# Patient Record
Sex: Female | Born: 1945 | Race: White | Hispanic: No | Marital: Married | Smoking: Former smoker
Health system: Southern US, Community
[De-identification: ages and names within clinical notes are randomized; demographics above are authoritative.]

## PROBLEM LIST (undated history)

## (undated) DIAGNOSIS — M51369 Other intervertebral disc degeneration, lumbar region without mention of lumbar back pain or lower extremity pain: Secondary | ICD-10-CM

## (undated) DIAGNOSIS — I10 Essential (primary) hypertension: Secondary | ICD-10-CM

## (undated) DIAGNOSIS — M5136 Other intervertebral disc degeneration, lumbar region: Secondary | ICD-10-CM

## (undated) DIAGNOSIS — C801 Malignant (primary) neoplasm, unspecified: Secondary | ICD-10-CM

## (undated) DIAGNOSIS — F419 Anxiety disorder, unspecified: Secondary | ICD-10-CM

## (undated) DIAGNOSIS — M1990 Unspecified osteoarthritis, unspecified site: Secondary | ICD-10-CM

## (undated) HISTORY — DX: Malignant (primary) neoplasm, unspecified: C80.1

## (undated) HISTORY — PX: OTHER SURGICAL HISTORY: SHX169

## (undated) HISTORY — PX: TONSILLECTOMY: SUR1361

---

## 2005-07-07 ENCOUNTER — Ambulatory Visit: Payer: Self-pay | Admitting: Internal Medicine

## 2005-07-30 ENCOUNTER — Ambulatory Visit: Payer: Self-pay | Admitting: Internal Medicine

## 2005-12-07 HISTORY — PX: BREAST BIOPSY: SHX20

## 2006-04-13 ENCOUNTER — Ambulatory Visit: Payer: Self-pay | Admitting: General Surgery

## 2006-05-24 ENCOUNTER — Ambulatory Visit: Payer: Self-pay | Admitting: General Surgery

## 2006-12-09 ENCOUNTER — Ambulatory Visit: Payer: Self-pay | Admitting: General Surgery

## 2007-09-15 ENCOUNTER — Ambulatory Visit: Payer: Self-pay | Admitting: Internal Medicine

## 2008-09-27 ENCOUNTER — Ambulatory Visit: Payer: Self-pay | Admitting: Internal Medicine

## 2009-10-02 ENCOUNTER — Ambulatory Visit: Payer: Self-pay | Admitting: Internal Medicine

## 2010-09-02 ENCOUNTER — Ambulatory Visit: Payer: Self-pay | Admitting: Internal Medicine

## 2010-10-07 ENCOUNTER — Ambulatory Visit: Payer: Self-pay | Admitting: General Surgery

## 2010-10-09 LAB — PATHOLOGY REPORT

## 2010-11-04 ENCOUNTER — Ambulatory Visit: Payer: Self-pay | Admitting: Internal Medicine

## 2011-02-04 ENCOUNTER — Other Ambulatory Visit: Payer: Self-pay | Admitting: Physician Assistant

## 2011-02-04 DIAGNOSIS — M79605 Pain in left leg: Secondary | ICD-10-CM

## 2011-02-08 ENCOUNTER — Other Ambulatory Visit: Payer: Self-pay

## 2011-03-16 ENCOUNTER — Ambulatory Visit: Payer: Self-pay

## 2011-11-12 ENCOUNTER — Ambulatory Visit: Payer: Self-pay | Admitting: Internal Medicine

## 2012-11-21 ENCOUNTER — Ambulatory Visit: Payer: Self-pay | Admitting: Internal Medicine

## 2013-05-05 DIAGNOSIS — M6740 Ganglion, unspecified site: Secondary | ICD-10-CM | POA: Insufficient documentation

## 2013-05-05 DIAGNOSIS — M19049 Primary osteoarthritis, unspecified hand: Secondary | ICD-10-CM | POA: Insufficient documentation

## 2013-06-22 ENCOUNTER — Encounter: Payer: Self-pay | Admitting: *Deleted

## 2013-12-05 ENCOUNTER — Ambulatory Visit: Payer: Self-pay | Admitting: Internal Medicine

## 2014-02-08 DIAGNOSIS — M6530 Trigger finger, unspecified finger: Secondary | ICD-10-CM | POA: Insufficient documentation

## 2014-03-21 ENCOUNTER — Other Ambulatory Visit: Payer: Self-pay | Admitting: Neurosurgery

## 2014-03-21 DIAGNOSIS — M549 Dorsalgia, unspecified: Secondary | ICD-10-CM

## 2014-03-29 ENCOUNTER — Ambulatory Visit
Admission: RE | Admit: 2014-03-29 | Discharge: 2014-03-29 | Disposition: A | Discharge status: Home / Self Care | Payer: 59 | Source: Ambulatory Visit | Attending: Neurosurgery | Admitting: Neurosurgery

## 2014-03-29 VITALS — BP 150/81 | HR 67

## 2014-03-29 DIAGNOSIS — M549 Dorsalgia, unspecified: Secondary | ICD-10-CM

## 2014-03-29 MED ORDER — DIAZEPAM 5 MG PO TABS
5.0000 mg | ORAL_TABLET | Freq: Once | ORAL | Status: AC
Start: 2014-03-29 — End: 2014-03-29
  Administered 2014-03-29: 5 mg via ORAL

## 2014-03-29 MED ORDER — IOHEXOL 180 MG/ML  SOLN
15.0000 mL | Freq: Once | INTRAMUSCULAR | Status: AC | PRN
  Administered 2014-03-29: 15 mL via INTRATHECAL

## 2014-03-29 NOTE — Progress Notes (Signed)
Patient states she has been off Buspar for at least the past two days.  jlk

## 2014-03-29 NOTE — Discharge Instructions (Signed)
Myelogram Discharge Instructions  1. Go home and rest quietly for the next 24 hours.  It is important to lie flat for the next 24 hours.  Get up only to go to the restroom.  You may lie in the bed or on a couch on your back, your stomach, your left side or your right side.  You may have one pillow under your head.  You may have pillows between your knees while you are on your side or under your knees while you are on your back.  2. DO NOT drive today.  Recline the seat as far back as it will go, while still wearing your seat belt, on the way home.  3. You may get up to go to the bathroom as needed.  You may sit up for 10 minutes to eat.  You may resume your normal diet and medications unless otherwise indicated.  Drink plenty of extra fluids today and tomorrow.  4. The incidence of a spinal headache with nausea and/or vomiting is about 5% (one in 20 patients).  If you develop a headache, lie flat and drink plenty of fluids until the headache goes away.  Caffeinated beverages may be helpful.  If you develop severe nausea and vomiting or a headache that does not go away with flat bed rest, call [phone].  5. You may resume normal activities after your 24 hours of bed rest is over; however, do not exert yourself strongly or do any heavy lifting tomorrow.  6. Call your physician for a follow-up appointment.   You may resume Buspar on Friday, March 30, 2014 after 11:00a.m.

## 2015-01-01 ENCOUNTER — Ambulatory Visit: Payer: Self-pay | Admitting: Internal Medicine

## 2016-03-14 ENCOUNTER — Inpatient Hospital Stay
Admission: EM | Admit: 2016-03-14 | Discharge: 2016-03-18 | DRG: 194 | Disposition: A | Discharge status: Home / Self Care | Payer: Medicare Other | Attending: Internal Medicine | Admitting: Internal Medicine

## 2016-03-14 ENCOUNTER — Encounter: Payer: Self-pay | Admitting: Emergency Medicine

## 2016-03-14 ENCOUNTER — Emergency Department: Payer: Medicare Other

## 2016-03-14 DIAGNOSIS — M1990 Unspecified osteoarthritis, unspecified site: Secondary | ICD-10-CM | POA: Diagnosis present

## 2016-03-14 DIAGNOSIS — J111 Influenza due to unidentified influenza virus with other respiratory manifestations: Secondary | ICD-10-CM

## 2016-03-14 DIAGNOSIS — J189 Pneumonia, unspecified organism: Secondary | ICD-10-CM | POA: Diagnosis not present

## 2016-03-14 DIAGNOSIS — F411 Generalized anxiety disorder: Secondary | ICD-10-CM | POA: Diagnosis present

## 2016-03-14 DIAGNOSIS — Z85828 Personal history of other malignant neoplasm of skin: Secondary | ICD-10-CM

## 2016-03-14 DIAGNOSIS — Z882 Allergy status to sulfonamides status: Secondary | ICD-10-CM

## 2016-03-14 DIAGNOSIS — Z87891 Personal history of nicotine dependence: Secondary | ICD-10-CM

## 2016-03-14 DIAGNOSIS — A419 Sepsis, unspecified organism: Secondary | ICD-10-CM

## 2016-03-14 DIAGNOSIS — T17890A Other foreign object in other parts of respiratory tract causing asphyxiation, initial encounter: Secondary | ICD-10-CM | POA: Diagnosis present

## 2016-03-14 DIAGNOSIS — I10 Essential (primary) hypertension: Secondary | ICD-10-CM | POA: Diagnosis present

## 2016-03-14 DIAGNOSIS — R6889 Other general symptoms and signs: Secondary | ICD-10-CM | POA: Diagnosis present

## 2016-03-14 DIAGNOSIS — R0602 Shortness of breath: Secondary | ICD-10-CM

## 2016-03-14 DIAGNOSIS — Z9889 Other specified postprocedural states: Secondary | ICD-10-CM

## 2016-03-14 DIAGNOSIS — Z825 Family history of asthma and other chronic lower respiratory diseases: Secondary | ICD-10-CM

## 2016-03-14 DIAGNOSIS — J9601 Acute respiratory failure with hypoxia: Secondary | ICD-10-CM

## 2016-03-14 DIAGNOSIS — Z8249 Family history of ischemic heart disease and other diseases of the circulatory system: Secondary | ICD-10-CM

## 2016-03-14 DIAGNOSIS — Z8261 Family history of arthritis: Secondary | ICD-10-CM

## 2016-03-14 DIAGNOSIS — R69 Illness, unspecified: Secondary | ICD-10-CM

## 2016-03-14 DIAGNOSIS — Z79899 Other long term (current) drug therapy: Secondary | ICD-10-CM

## 2016-03-14 DIAGNOSIS — F419 Anxiety disorder, unspecified: Secondary | ICD-10-CM | POA: Diagnosis present

## 2016-03-14 DIAGNOSIS — C4490 Unspecified malignant neoplasm of skin, unspecified: Secondary | ICD-10-CM | POA: Insufficient documentation

## 2016-03-14 DIAGNOSIS — Z823 Family history of stroke: Secondary | ICD-10-CM

## 2016-03-14 DIAGNOSIS — R0902 Hypoxemia: Secondary | ICD-10-CM | POA: Diagnosis present

## 2016-03-14 DIAGNOSIS — R6510 Systemic inflammatory response syndrome (SIRS) of non-infectious origin without acute organ dysfunction: Secondary | ICD-10-CM | POA: Diagnosis present

## 2016-03-14 HISTORY — DX: Other intervertebral disc degeneration, lumbar region: M51.36

## 2016-03-14 HISTORY — DX: Essential (primary) hypertension: I10

## 2016-03-14 HISTORY — DX: Anxiety disorder, unspecified: F41.9

## 2016-03-14 HISTORY — DX: Unspecified osteoarthritis, unspecified site: M19.90

## 2016-03-14 HISTORY — DX: Other intervertebral disc degeneration, lumbar region without mention of lumbar back pain or lower extremity pain: M51.369

## 2016-03-14 LAB — COMPREHENSIVE METABOLIC PANEL
ALT: 18 U/L (ref 14–54)
AST: 25 U/L (ref 15–41)
Albumin: 4.1 g/dL (ref 3.5–5.0)
Alkaline Phosphatase: 95 U/L (ref 38–126)
Anion gap: 10 (ref 5–15)
BUN: 11 mg/dL (ref 6–20)
CO2: 22 mmol/L (ref 22–32)
Calcium: 8.9 mg/dL (ref 8.9–10.3)
Chloride: 99 mmol/L — ABNORMAL LOW (ref 101–111)
Creatinine, Ser: 0.74 mg/dL (ref 0.44–1.00)
GFR calc Af Amer: >60 mL/min (ref >60)
GFR calc non Af Amer: >60 mL/min (ref >60)
Glucose, Bld: 107 mg/dL — ABNORMAL HIGH (ref 65–99)
Potassium: 3.4 mmol/L — ABNORMAL LOW (ref 3.5–5.1)
Sodium: 131 mmol/L — ABNORMAL LOW (ref 135–145)
Total Bilirubin: 0.5 mg/dL (ref 0.3–1.2)
Total Protein: 7.4 g/dL (ref 6.5–8.1)

## 2016-03-14 LAB — CBC WITH DIFFERENTIAL/PLATELET
Basophils Absolute: 0 10*3/uL (ref 0–0.1)
Basophils Relative: 0 %
Eosinophils Absolute: 0 10*3/uL (ref 0–0.7)
Eosinophils Relative: 0 %
HCT: 38.6 % (ref 35.0–47.0)
Hemoglobin: 13 g/dL (ref 12.0–16.0)
Lymphocytes Relative: 2 %
Lymphs Abs: 0.4 10*3/uL — ABNORMAL LOW (ref 1.0–3.6)
MCH: 30.1 pg (ref 26.0–34.0)
MCHC: 33.8 g/dL (ref 32.0–36.0)
MCV: 89 fL (ref 80.0–100.0)
Monocytes Absolute: 1.4 10*3/uL — ABNORMAL HIGH (ref 0.2–0.9)
Monocytes Relative: 7 %
Neutro Abs: 16.8 10*3/uL — ABNORMAL HIGH (ref 1.4–6.5)
Neutrophils Relative %: 91 %
Platelets: 183 10*3/uL (ref 150–440)
RBC: 4.33 MIL/uL (ref 3.80–5.20)
RDW: 13.6 % (ref 11.5–14.5)
WBC: 18.6 10*3/uL — ABNORMAL HIGH (ref 3.6–11.0)

## 2016-03-14 LAB — URINALYSIS COMPLETE WITH MICROSCOPIC (ARMC ONLY)
Bacteria, UA: NONE SEEN
Bilirubin Urine: NEGATIVE
Glucose, UA: NEGATIVE mg/dL
Hgb urine dipstick: NEGATIVE
Ketones, ur: NEGATIVE mg/dL
Leukocytes, UA: NEGATIVE
Nitrite: NEGATIVE
Protein, ur: NEGATIVE mg/dL
Specific Gravity, Urine: 1.004 — ABNORMAL LOW (ref 1.005–1.030)
pH: 6 (ref 5.0–8.0)

## 2016-03-14 LAB — INFLUENZA PANEL BY PCR (TYPE A & B)
H1N1 flu by pcr: NOT DETECTED
Influenza A By PCR: NEGATIVE
Influenza B By PCR: NEGATIVE

## 2016-03-14 LAB — SALICYLATE LEVEL: Salicylate Lvl: <4 mg/dL (ref 2.8–30.0)

## 2016-03-14 LAB — RAPID INFLUENZA A&B ANTIGENS
Influenza A (ARMC): NEGATIVE
Influenza B (ARMC): NEGATIVE

## 2016-03-14 LAB — LIPASE, BLOOD: Lipase: 20 U/L (ref 11–51)

## 2016-03-14 LAB — LACTIC ACID, PLASMA: Lactic Acid, Venous: 1.1 mmol/L (ref 0.5–2.0)

## 2016-03-14 MED ORDER — SODIUM CHLORIDE 0.9% FLUSH
3.0000 mL | Freq: Two times a day (BID) | INTRAVENOUS | Status: DC
  Administered 2016-03-15 – 2016-03-18 (×6): 3 mL via INTRAVENOUS

## 2016-03-14 MED ORDER — KETOROLAC TROMETHAMINE 30 MG/ML IJ SOLN
30.0000 mg | Freq: Once | INTRAMUSCULAR | Status: DC
  Filled 2016-03-14: qty 1

## 2016-03-14 MED ORDER — PIPERACILLIN-TAZOBACTAM 3.375 G IVPB 30 MIN: 3.3750 g | Freq: Once | INTRAVENOUS | Status: DC

## 2016-03-14 MED ORDER — ONDANSETRON HCL 4 MG PO TABS: 4.0000 mg | ORAL_TABLET | Freq: Four times a day (QID) | ORAL | Status: DC | PRN

## 2016-03-14 MED ORDER — DEXAMETHASONE SODIUM PHOSPHATE 10 MG/ML IJ SOLN
10.0000 mg | Freq: Once | INTRAMUSCULAR | Status: AC
  Administered 2016-03-14: 10 mg via INTRAVENOUS

## 2016-03-14 MED ORDER — DEXTROSE 5 % IV SOLN
1.0000 g | Freq: Once | INTRAVENOUS | Status: AC
  Administered 2016-03-14: 1 g via INTRAVENOUS
  Filled 2016-03-14: qty 10

## 2016-03-14 MED ORDER — ACETAMINOPHEN 650 MG RE SUPP: 650.0000 mg | Freq: Four times a day (QID) | RECTAL | Status: DC | PRN

## 2016-03-14 MED ORDER — DEXAMETHASONE SODIUM PHOSPHATE 10 MG/ML IJ SOLN
10.0000 mg | Freq: Once | INTRAMUSCULAR | Status: DC
  Filled 2016-03-14: qty 1

## 2016-03-14 MED ORDER — ONDANSETRON 8 MG PO TBDP
8.0000 mg | ORAL_TABLET | Freq: Once | ORAL | Status: AC
  Administered 2016-03-14: 8 mg via ORAL
  Filled 2016-03-14: qty 1

## 2016-03-14 MED ORDER — GUAIFENESIN-DM 100-10 MG/5ML PO SYRP
5.0000 mL | ORAL_SOLUTION | ORAL | Status: DC | PRN
  Filled 2016-03-14: qty 5

## 2016-03-14 MED ORDER — DEXTROSE 5 % IV SOLN
500.0000 mg | Freq: Once | INTRAVENOUS | Status: AC
  Administered 2016-03-14: 500 mg via INTRAVENOUS
  Filled 2016-03-14: qty 500

## 2016-03-14 MED ORDER — SODIUM CHLORIDE 0.9 % IV SOLN
INTRAVENOUS | Status: AC
  Administered 2016-03-14: 23:00:00 via INTRAVENOUS

## 2016-03-14 MED ORDER — LOSARTAN POTASSIUM 50 MG PO TABS
25.0000 mg | ORAL_TABLET | Freq: Every day | ORAL | Status: DC
  Administered 2016-03-14 – 2016-03-18 (×5): 25 mg via ORAL
  Filled 2016-03-14 (×4): qty 1
  Filled 2016-03-14: qty 2

## 2016-03-14 MED ORDER — SODIUM CHLORIDE 0.9 % IV BOLUS (SEPSIS)
1000.0000 mL | Freq: Once | INTRAVENOUS | Status: AC
  Administered 2016-03-14: 1000 mL via INTRAVENOUS

## 2016-03-14 MED ORDER — ENOXAPARIN SODIUM 40 MG/0.4ML ~~LOC~~ SOLN
40.0000 mg | SUBCUTANEOUS | Status: DC
  Administered 2016-03-14 – 2016-03-17 (×4): 40 mg via SUBCUTANEOUS
  Filled 2016-03-14 (×4): qty 0.4

## 2016-03-14 MED ORDER — AMOXICILLIN 875 MG PO TABS: 875.0000 mg | ORAL_TABLET | Freq: Two times a day (BID) | ORAL | Status: DC

## 2016-03-14 MED ORDER — ACETAMINOPHEN 325 MG PO TABS: 650.0000 mg | ORAL_TABLET | Freq: Four times a day (QID) | ORAL | Status: DC | PRN

## 2016-03-14 MED ORDER — BUSPIRONE HCL 10 MG PO TABS
15.0000 mg | ORAL_TABLET | Freq: Two times a day (BID) | ORAL | Status: DC
  Administered 2016-03-14 – 2016-03-18 (×8): 15 mg via ORAL
  Filled 2016-03-14: qty 1
  Filled 2016-03-14 (×5): qty 2
  Filled 2016-03-14 (×3): qty 1
  Filled 2016-03-14: qty 2

## 2016-03-14 MED ORDER — GABAPENTIN 300 MG PO CAPS
300.0000 mg | ORAL_CAPSULE | Freq: Two times a day (BID) | ORAL | Status: DC
  Administered 2016-03-14 – 2016-03-18 (×8): 300 mg via ORAL
  Filled 2016-03-14 (×8): qty 1

## 2016-03-14 MED ORDER — DEXTROSE 5 % IV SOLN
500.0000 mg | INTRAVENOUS | Status: DC
  Administered 2016-03-15 – 2016-03-18 (×4): 500 mg via INTRAVENOUS
  Filled 2016-03-14 (×5): qty 500

## 2016-03-14 MED ORDER — KETOROLAC TROMETHAMINE 30 MG/ML IJ SOLN
30.0000 mg | Freq: Once | INTRAMUSCULAR | Status: AC
  Administered 2016-03-14: 30 mg via INTRAVENOUS

## 2016-03-14 MED ORDER — ONDANSETRON HCL 4 MG/2ML IJ SOLN: 4.0000 mg | Freq: Four times a day (QID) | INTRAMUSCULAR | Status: DC | PRN

## 2016-03-14 MED ORDER — FAMOTIDINE 20 MG PO TABS
40.0000 mg | ORAL_TABLET | Freq: Once | ORAL | Status: AC
  Administered 2016-03-14: 40 mg via ORAL
  Filled 2016-03-14: qty 2

## 2016-03-14 MED ORDER — DEXTROSE 5 % IV SOLN
1.0000 g | INTRAVENOUS | Status: DC
  Administered 2016-03-15 – 2016-03-17 (×3): 1 g via INTRAVENOUS
  Filled 2016-03-14 (×4): qty 10

## 2016-03-14 NOTE — ED Notes (Signed)
Patient took tylenol she brought in her purse. MD approved

## 2016-03-14 NOTE — ED Notes (Signed)
Pt placed on oxygen at 2lpm for pox of 88% on ra. Pt with clear breath sounds in all lobes except rll, rales, rll.

## 2016-03-14 NOTE — ED Notes (Signed)
hospitalist in to see pt.

## 2016-03-14 NOTE — H&P (Signed)
[facility] [place] NAME: Hannah Greene    MR#:  [id number]  DATE OF BIRTH:  1946/12/04  DATE OF ADMISSION:  03/14/2016  PRIMARY CARE PHYSICIAN: Albina Billet, MD   REQUESTING/REFERRING PHYSICIAN: Joni Fears, MD  CHIEF COMPLAINT:   Chief Complaint  Patient presents with  . Cough  . Sore Throat    HISTORY OF PRESENT ILLNESS:  Hannah Greene  is a 70 y.o. female who presents with Fever, cough, malaise. Patient states that about a week ago she began having some symptoms of congestion and cough. This progressed for couple of days, and then she began to feel a little bit better then about 2 days ago she began to feel worse again with increased malaise. However, she states that this time she had recurrent increasing cough, but also myalgias and arthralgias, some loose stools, and earlier today developed a fever to 101.7. On presentation in the ED she was febrile, tachycardic, and found to have an elevated white count with bandemia. Chest x-ray does not show any focal pneumonia, and her rapid flu test was negative. Hospitalists were called for admission.  PAST MEDICAL HISTORY:   Past Medical History  Diagnosis Date  . Cancer ([place])     skin  . Osteoarthritis   . DDD (degenerative disc disease), lumbar   . Anxiety     PAST SURGICAL HISTORY:   Past Surgical History  Procedure Laterality Date  . Tonsillectomy    . Skin mole excision     . Uterine polyps     . Breast biopsy Left 2007    SOCIAL HISTORY:   Social History  Substance Use Topics  . Smoking status: Former Smoker -- 1.00 packs/day for 4 years  . Smokeless tobacco: Never Used  . Alcohol Use: No    FAMILY HISTORY:   Family History  Problem Relation Age of Onset  . Heart disease Father   . Osteoarthritis Father   . COPD Mother   . Osteoarthritis Mother   . Stroke Mother     DRUG ALLERGIES:   Allergies  Allergen Reactions  . Sulfa Antibiotics Hives     MEDICATIONS AT HOME:   Prior to Admission medications   Medication Sig Start Date End Date Taking? Authorizing Provider  acetaminophen (TYLENOL) 500 MG tablet Take 500 mg by mouth at bedtime.   Yes Historical Provider, MD  busPIRone (BUSPAR) 15 MG tablet Take 15 mg by mouth 2 (two) times daily.   Yes Historical Provider, MD  gabapentin (NEURONTIN) 300 MG capsule Take 300 mg by mouth 2 (two) times daily.   Yes Historical Provider, MD  Glucosamine-Chondroitin (COSAMIN DS) 500-400 MG CAPS Take 1 capsule by mouth 2 (two) times daily.   Yes Historical Provider, MD  losartan (COZAAR) 25 MG tablet Take 25 mg by mouth daily.   Yes Historical Provider, MD  naproxen sodium (ANAPROX) 220 MG tablet Take 440 mg by mouth every morning.   Yes Historical Provider, MD  Red Yeast Rice 600 MG CAPS Take 1 capsule by mouth 2 (two) times daily.   Yes Historical Provider, MD  vitamin C (ASCORBIC ACID) 500 MG tablet Take 500 mg by mouth 2 (two) times daily.   Yes Historical Provider, MD  amoxicillin (AMOXIL) 875 MG tablet Take 1 tablet (875 mg total) by mouth 2 (two) times daily. 03/14/16   Carrie Mew, MD    REVIEW OF SYSTEMS:  Review of Systems  Constitutional: Positive for fever and malaise/fatigue.  Negative for chills and weight loss.  HENT: Negative for ear pain, hearing loss and tinnitus.   Eyes: Negative for blurred vision, double vision, pain and redness.  Respiratory: Positive for cough and sputum production. Negative for hemoptysis and shortness of breath.   Cardiovascular: Negative for chest pain, palpitations, orthopnea and leg swelling.  Gastrointestinal: Positive for diarrhea. Negative for nausea, vomiting, abdominal pain and constipation.  Genitourinary: Negative for dysuria, frequency and hematuria.  Musculoskeletal: Negative for back pain, joint pain and neck pain.  Skin:       No acne, rash, or lesions  Neurological: Negative for dizziness, tremors, focal weakness and weakness.   Endo/Heme/Allergies: Negative for polydipsia. Does not bruise/bleed easily.  Psychiatric/Behavioral: Negative for depression. The patient is not nervous/anxious and does not have insomnia.      VITAL SIGNS:   Filed Vitals:   03/14/16 2030 03/14/16 2045 03/14/16 2100 03/14/16 2116  BP: 159/77 154/74    Pulse: 108 107 110   Temp:    100 F (37.8 C)  TempSrc:    Oral  Resp: 17 19 18    Height:      Weight:      SpO2: 92% 92% 92%    Wt Readings from Last 3 Encounters:  03/14/16 82.555 kg (182 lb)    PHYSICAL EXAMINATION:  Physical Exam  Vitals reviewed. Constitutional: She is oriented to person, place, and time. She appears well-developed and well-nourished. No distress.  HENT:  Head: Normocephalic and atraumatic.  Mouth/Throat: Oropharynx is clear and moist.  Eyes: Conjunctivae and EOM are normal. Pupils are equal, round, and reactive to light. No scleral icterus.  Neck: Normal range of motion. Neck supple. No JVD present. No thyromegaly present.  Cardiovascular: Regular rhythm and intact distal pulses.  Exam reveals no gallop and no friction rub.   No murmur heard. Tachycardic  Respiratory: Effort normal and breath sounds normal. No respiratory distress. She has no wheezes. She has no rales.  Bibasilar coarse breath sounds  GI: Soft. Bowel sounds are normal. She exhibits no distension. There is no tenderness.  Musculoskeletal: Normal range of motion. She exhibits no edema.  No arthritis, no gout  Lymphadenopathy:    She has no cervical adenopathy.  Neurological: She is alert and oriented to person, place, and time. No cranial nerve deficit.  No dysarthria, no aphasia  Skin: Skin is warm and dry. No rash noted. No erythema.  Psychiatric: She has a normal mood and affect. Her behavior is normal. Judgment and thought content normal.    LABORATORY PANEL:   CBC  Recent Labs Lab 03/14/16 1943  WBC 18.6*  HGB 13.0  HCT 38.6  PLT 183    ------------------------------------------------------------------------------------------------------------------  Chemistries   Recent Labs Lab 03/14/16 1943  NA 131*  K 3.4*  CL 99*  CO2 22  GLUCOSE 107*  BUN 11  CREATININE 0.74  CALCIUM 8.9  AST 25  ALT 18  ALKPHOS 95  BILITOT 0.5   ------------------------------------------------------------------------------------------------------------------  Cardiac Enzymes No results for input(s): TROPONINI in the last 168 hours. ------------------------------------------------------------------------------------------------------------------  RADIOLOGY:  Dg Chest 2 View  03/14/2016  CLINICAL DATA:  Sore throat and fever this afternoon. EXAM: CHEST  2 VIEW COMPARISON:  None. FINDINGS: Heart size is upper normal. Overall cardiomediastinal silhouette is within normal limits in size and configuration. Atherosclerotic calcifications noted at the aortic arch. Lungs are hyperexpanded suggesting COPD. Suspect associated chronic bronchitic changes centrally and chronic interstitial lung disease. No evidence of pneumonia. No pleural effusion or pneumothorax  seen. Mild degenerative spurring is noted within the kyphotic thoracic spine. No evidence of acute osseous abnormality. IMPRESSION: 1. No acute findings.  No evidence of pneumonia. 2. Hyperexpanded lungs suggesting COPD. Suspect associated chronic bronchitic changes centrally and at least some degree of chronic interstitial lung disease. Electronically Signed   By: Franki Cabot M.D.   On: 03/14/2016 18:53    EKG:  No orders found for this or any previous visit.  IMPRESSION AND PLAN:  Principal Problem:   SIRS (systemic inflammatory response syndrome) (HCC) - unclear at this time if the source is bacterial or viral. Her history seems like she may have developed flu symptoms a week ago and then subsequently developed some sort of bacterial infection, or alternatively she may have had some  other upper respiratory infection and developed influenza symptoms and then contracted influenza virus 2 days ago. She isn't started on antibiotics in the ED for community acquired pneumonia, and blood cultures have been sent. UA and urine culture pending to be collected. We will order a sputum culture as well. Active Problems:   Flu-like symptoms - rapid flu test was negative, PCR was sent and is pending. If PCR is positive I will start this patient on Tamiflu given the ambiguity of when her true flulike symptoms started.   Anxiety - continue home meds   Osteoarthritis - treat when necessary  All the records are reviewed and case discussed with ED provider. Management plans discussed with the patient and/or family.  DVT PROPHYLAXIS: SubQ lovenox  GI PROPHYLAXIS: None  ADMISSION STATUS: Observation  CODE STATUS: Full Code Status History    This patient does not have a recorded code status. Please follow your organizational policy for patients in this situation.      TOTAL TIME TAKING CARE OF THIS PATIENT: 45 minutes.    Amarissa Koerner Heeia 03/14/2016, 9:28 PM  [facility]  Office  [phone]  CC: Primary care physician; Albina Billet, MD

## 2016-03-14 NOTE — ED Notes (Signed)
Pt provided with meal tray and po fluids per request. Pt up to restroom to void with staff assist. Pt with increased tachypnea with exertion. ivf continue to infuse. Pt with resp rate of 22 at rest at this time.

## 2016-03-14 NOTE — Care Management Obs Status (Signed)
[place] NOTIFICATION   Patient Details  Name: Hannah Greene MRN: [id number] Date of Birth: 02-Jan-1946   Medicare Observation Status Notification Given:  Yes    CrutchfieldAntony Haste, RN 03/14/2016, 9:37 PM

## 2016-03-14 NOTE — ED Provider Notes (Signed)
[facility] Emergency Department Provider Note  ____________________________________________  Time seen: 6:20 PM  I have reviewed the triage vital signs and the nursing notes.   HISTORY  Chief Complaint Cough and Sore Throat    HPI Hannah Greene is a 70 y.o. female who complains of rhinorrhea and nasal congestion sore throat and productive cough for the past 3 days. Worsening. Has diffuse myalgias and fatigue. Denies chest pain or shortness of breath. No abdominal pain nausea vomiting or diarrhea. Had a fever to 101 at home. Tolerating oral intake. Denies dizziness or syncope.     Past Medical History  Diagnosis Date  . Cancer ([place])     skin  . Arthritis      There are no active problems to display for this patient.    Past Surgical History  Procedure Laterality Date  . Tonsillectomy    . Skin mole excision     . Uterine polyps     . Breast biopsy Left 2007     Current Outpatient Rx  Name  Route  Sig  Dispense  Refill  . amoxicillin (AMOXIL) 875 MG tablet   Oral   Take 1 tablet (875 mg total) by mouth 2 (two) times daily.   14 tablet   0      Allergies Sulfa antibiotics   History reviewed. No pertinent family history.  Social History Social History  Substance Use Topics  . Smoking status: Former Smoker -- 1.00 packs/day for 4 years  . Smokeless tobacco: Never Used  . Alcohol Use: No    Review of Systems  Constitutional:  Positive fevers and shaking chills.. No weight changes Eyes:   No vision changes.  ENT:   Positive sore throat and runny nose.. Cardiovascular:   No chest pain. Respiratory:   No dyspnea positive productive cough. Gastrointestinal:   Negative for abdominal pain, vomiting and diarrhea.  No BRBPR or melena. Genitourinary:   Negative for dysuria or difficulty urinating. Musculoskeletal:   Negative for focal pain or swelling Skin:   Negative for rash. Neurological:   Negative for headaches, focal  weakness or numbness.  10-point ROS otherwise negative.  ____________________________________________   PHYSICAL EXAM:  VITAL SIGNS: ED Triage Vitals  Enc Vitals Group     BP 03/14/16 1721 199/102 mmHg     Pulse Rate 03/14/16 1721 122     Resp 03/14/16 1721 24     Temp 03/14/16 1721 101.2 F (38.4 C)     Temp Source 03/14/16 1721 Oral     SpO2 03/14/16 1721 93 %     Weight 03/14/16 1721 182 lb (82.555 kg)     Height 03/14/16 1721 5\' 9"  (1.753 m)     Head Cir --      Peak Flow --      Pain Score 03/14/16 1723 8     Pain Loc --      Pain Edu? --      Excl. in [place]? --     Vital signs reviewed, nursing assessments reviewed.   Constitutional:   Alert and oriented. Well appearing and in no distress. Eyes:   No scleral icterus. No conjunctival pallor. PERRL. EOMI ENT   Head:   Normocephalic and atraumatic.   Nose:   Positive nasal congestion. No septal hematoma   Mouth/Throat:   MMM, mild pharyngeal erythema. No peritonsillar mass.    Neck:   No stridor. No SubQ emphysema. No meningismus. Hematological/Lymphatic/Immunilogical:   No cervical lymphadenopathy. Cardiovascular:  Tachycardia heart rate 120. Symmetric bilateral radial and DP pulses.  No murmurs.  Respiratory:   Normal respiratory effort without tachypnea nor retractions. Good air entry in all lung fields. Inspiratory crackles in the right upper lung. No wheezing. Gastrointestinal:   Soft and nontender. Non distended. There is no CVA tenderness.  No rebound, rigidity, or guarding. Genitourinary:   deferred Musculoskeletal:   Nontender with normal range of motion in all extremities. No joint effusions.  No lower extremity tenderness.  No edema. Neurologic:   Normal speech and language.  CN 2-10 normal. Motor grossly intact. No gross focal neurologic deficits are appreciated.  Skin:    Skin is warm, dry and intact. No rash noted.  No petechiae, purpura, or bullae. Psychiatric:   Mood and affect are  normal. ____________________________________________    LABS (pertinent positives/negatives) (all labs ordered are listed, but only abnormal results are displayed) Labs Reviewed  CBC WITH DIFFERENTIAL/PLATELET - Abnormal; Notable for the following:    WBC 18.6 (*)    Neutro Abs 16.8 (*)    Lymphs Abs 0.4 (*)    Monocytes Absolute 1.4 (*)    All other components within normal limits  RAPID INFLUENZA A&B ANTIGENS ([facility] ONLY)  CULTURE, BLOOD (ROUTINE X 2)  CULTURE, BLOOD (ROUTINE X 2)  URINE CULTURE  LACTIC ACID, PLASMA  LACTIC ACID, PLASMA  URINALYSIS COMPLETEWITH MICROSCOPIC ([facility] ONLY)  COMPREHENSIVE METABOLIC PANEL  LIPASE, BLOOD  SALICYLATE LEVEL  INFLUENZA PANEL BY PCR (TYPE A & B, H1N1)   ____________________________________________   EKG    ____________________________________________    RADIOLOGY  Chest x-ray unremarkable. Consistent with an underlying COPD.  ____________________________________________   PROCEDURES CRITICAL CARE Performed by: Joni Fears, Donis Pinder   Total critical care time: 35 minutes  Critical care time was exclusive of separately billable procedures and treating other patients.  Critical care was necessary to treat or prevent imminent or life-threatening deterioration.  Critical care was time spent personally by me on the following activities: development of treatment plan with patient and/or surrogate as well as nursing, discussions with consultants, evaluation of patient's response to treatment, examination of patient, obtaining history from patient or surrogate, ordering and performing treatments and interventions, ordering and review of laboratory studies, ordering and review of radiographic studies, pulse oximetry and re-evaluation of patient's condition.   ____________________________________________   INITIAL IMPRESSION / ASSESSMENT AND PLAN / ED COURSE  Pertinent labs & imaging results that were available during my care of  the patient were reviewed by me and considered in my medical decision making (see chart for details).  Patient resents with fever tachycardia at a constellation of symptoms most consistent with an influenza-like illness. Exam also raises suspicion for community acquired pneumonia so proceed with a chest x-ray and influenza test.  ----------------------------------------- 8:04 PM on 03/14/2016 -----------------------------------------  Fevers continued to increase to 103. Tachycardia slightly worsened as well, and on discussing the negative x-ray and flu result with the patient I find that she is actually at 86% on room air. This is all consistent with a community acquired pneumonia even though the x-ray is negative. At this point with worsening vital signs we'll go ahead and give IV fluids, IV antibiotics with ceftriaxone and azithromycin, and a full lab workup.   ----------------------------------------- 8:24 PM on 03/14/2016 -----------------------------------------  Heart rate improving to 110. White blood cell count 18,000. Other labs unremarkable. With the acute hypoxic respiratory failure, I discussed the case with the hospitalist for admission.    ____________________________________________   FINAL  CLINICAL IMPRESSION(S) / ED DIAGNOSES  Final diagnoses:  Influenza-like illness   community acquired pneumonia Acute respiratory failure with hypoxia Sepsis  Carrie Mew, MD 03/14/16 2026

## 2016-03-14 NOTE — Progress Notes (Signed)
Pharmacy Antibiotic Note  Hannah Greene is a 70 y.o. female admitted on 03/14/2016 with CAP.  Pharmacy has been consulted for ceftriaxone dosing.  Plan: Ceftriaxone 1 gram q 24 hours ordered. Patient also on azithromycin 500 mg daily.  Height: 5\' 9"  (175.3 cm) Weight: 182 lb (82.555 kg) IBW/kg (Calculated) : 66.2  Temp (24hrs), Avg:100.9 F (38.3 C), Min:99.2 F (37.3 C), Max:103 F (39.4 C)   Recent Labs Lab 03/14/16 1943  WBC 18.6*  CREATININE 0.74  LATICACIDVEN 1.1    Estimated Creatinine Clearance: 78.4 mL/min (by C-G formula based on Cr of 0.74).    Allergies  Allergen Reactions  . Sulfa Antibiotics Hives    Antimicrobials this admission: ceftriaxone azithromycin >>    >>   Dose adjustments this admission:   Microbiology results: 4/8 BCx: pending 4/8 UCx: pending  4/8 Sputum: pending  4/8 UA: (-)  Thank you for allowing pharmacy to be a part of this patient's care.  Tonji Elliff S 03/14/2016 10:40 PM

## 2016-03-14 NOTE — ED Notes (Signed)
Pt complains of cough and congestion for 3 days. Pt also complains of sore throat and was running a fever this afternoon.

## 2016-03-14 NOTE — ED Notes (Signed)
Pt and husband updated on treatment plan by dr. Joni Fears.

## 2016-03-15 DIAGNOSIS — Z87891 Personal history of nicotine dependence: Secondary | ICD-10-CM | POA: Diagnosis not present

## 2016-03-15 DIAGNOSIS — Z8249 Family history of ischemic heart disease and other diseases of the circulatory system: Secondary | ICD-10-CM | POA: Diagnosis not present

## 2016-03-15 DIAGNOSIS — Z882 Allergy status to sulfonamides status: Secondary | ICD-10-CM | POA: Diagnosis not present

## 2016-03-15 DIAGNOSIS — R0902 Hypoxemia: Secondary | ICD-10-CM | POA: Diagnosis present

## 2016-03-15 DIAGNOSIS — Z85828 Personal history of other malignant neoplasm of skin: Secondary | ICD-10-CM | POA: Diagnosis not present

## 2016-03-15 DIAGNOSIS — Z8261 Family history of arthritis: Secondary | ICD-10-CM | POA: Diagnosis not present

## 2016-03-15 DIAGNOSIS — F411 Generalized anxiety disorder: Secondary | ICD-10-CM | POA: Diagnosis present

## 2016-03-15 DIAGNOSIS — Z823 Family history of stroke: Secondary | ICD-10-CM | POA: Diagnosis not present

## 2016-03-15 DIAGNOSIS — Z79899 Other long term (current) drug therapy: Secondary | ICD-10-CM | POA: Diagnosis not present

## 2016-03-15 DIAGNOSIS — Z825 Family history of asthma and other chronic lower respiratory diseases: Secondary | ICD-10-CM | POA: Diagnosis not present

## 2016-03-15 DIAGNOSIS — T17890A Other foreign object in other parts of respiratory tract causing asphyxiation, initial encounter: Secondary | ICD-10-CM | POA: Diagnosis present

## 2016-03-15 DIAGNOSIS — I10 Essential (primary) hypertension: Secondary | ICD-10-CM | POA: Diagnosis present

## 2016-03-15 DIAGNOSIS — Z9889 Other specified postprocedural states: Secondary | ICD-10-CM | POA: Diagnosis not present

## 2016-03-15 DIAGNOSIS — M1990 Unspecified osteoarthritis, unspecified site: Secondary | ICD-10-CM | POA: Diagnosis present

## 2016-03-15 DIAGNOSIS — J189 Pneumonia, unspecified organism: Secondary | ICD-10-CM | POA: Diagnosis present

## 2016-03-15 LAB — BASIC METABOLIC PANEL
Anion gap: 7 (ref 5–15)
BUN: 12 mg/dL (ref 6–20)
CO2: 23 mmol/L (ref 22–32)
Calcium: 8.5 mg/dL — ABNORMAL LOW (ref 8.9–10.3)
Chloride: 107 mmol/L (ref 101–111)
Creatinine, Ser: 0.79 mg/dL (ref 0.44–1.00)
GFR calc Af Amer: >60 mL/min (ref >60)
GFR calc non Af Amer: >60 mL/min (ref >60)
Glucose, Bld: 148 mg/dL — ABNORMAL HIGH (ref 65–99)
Potassium: 3.7 mmol/L (ref 3.5–5.1)
Sodium: 137 mmol/L (ref 135–145)

## 2016-03-15 LAB — CBC
HCT: 38.7 % (ref 35.0–47.0)
Hemoglobin: 13.1 g/dL (ref 12.0–16.0)
MCH: 30 pg (ref 26.0–34.0)
MCHC: 33.8 g/dL (ref 32.0–36.0)
MCV: 88.6 fL (ref 80.0–100.0)
Platelets: 181 10*3/uL (ref 150–440)
RBC: 4.36 MIL/uL (ref 3.80–5.20)
RDW: 13.5 % (ref 11.5–14.5)
WBC: 21.7 10*3/uL — ABNORMAL HIGH (ref 3.6–11.0)

## 2016-03-15 LAB — EXPECTORATED SPUTUM ASSESSMENT W GRAM STAIN, RFLX TO RESP C

## 2016-03-15 LAB — EXPECTORATED SPUTUM ASSESSMENT W REFEX TO RESP CULTURE

## 2016-03-15 NOTE — Progress Notes (Addendum)
[place] at [facility]                                                                                                                                                                                            Patient Demographics   Hannah Greene, is a 70 y.o. female, DOB - 02/13/1946, [id number]  Admit date - 03/14/2016   Admitting Physician Lance Coon, MD  Outpatient Primary MD for the patient is TATE,DENNY C, MD   LOS -   Subjective: Patient admitted with cough congestion and hypoxia, high fevers Patient reports some improvement in her symptoms She is still requiring 2 L of oxygen      Review of Systems:   CONSTITUTIONAL: No documented fever. No fatigue, weakness. No weight gain, no weight loss.  EYES: No blurry or double vision.  ENT: No tinnitus. No postnasal drip. No redness of the oropharynx.  RESPIRATORY: Positive cough, no wheeze, no hemoptysis.Positive  dyspnea.  CARDIOVASCULAR: No chest pain. No orthopnea. No palpitations. No syncope.  GASTROINTESTINAL: No nausea, no vomiting or diarrhea. No abdominal pain. No melena or hematochezia.  GENITOURINARY: No dysuria or hematuria.  ENDOCRINE: No polyuria or nocturia. No heat or cold intolerance.  HEMATOLOGY: No anemia. No bruising. No bleeding.  INTEGUMENTARY: No rashes. No lesions.  MUSCULOSKELETAL: No arthritis. No swelling. No gout.  NEUROLOGIC: No numbness, tingling, or ataxia. No seizure-type activity.  PSYCHIATRIC: No anxiety. No insomnia. No ADD.    Vitals:   Filed Vitals:   03/14/16 2152 03/14/16 2212 03/15/16 0425 03/15/16 0813  BP: 144/77 154/64 138/62 158/72  Pulse:  106 76 80  Temp:  99.2 F (37.3 C) 98 F (36.7 C) 98 F (36.7 C)  TempSrc:  Oral Oral Oral  Resp:  20 18 16   Height:      Weight:  84.233 kg (185 lb 11.2 oz)    SpO2: 93% 93% 97% 99%    Wt Readings from Last 3 Encounters:  03/14/16 84.233 kg (185 lb 11.2 oz)     Intake/Output Summary (Last  24 hours) at 03/15/16 1001 Last data filed at 03/15/16 0939  Gross per 24 hour  Intake    360 ml  Output      0 ml  Net    360 ml    Physical Exam:   GENERAL: Pleasant-appearing in no apparent distress.  HEAD, EYES, EARS, NOSE AND THROAT: Atraumatic, normocephalic. Extraocular muscles are intact. Pupils equal and reactive to light. Sclerae anicteric. No conjunctival injection. No oro-pharyngeal erythema.  NECK: Supple. There is no jugular venous distention. No bruits, no lymphadenopathy, no thyromegaly.  HEART: Regular rate and rhythm,. No murmurs, no rubs, no clicks.  LUNGS:Decreased breath sounds bilaterally without any rales rhonchi wheezing  ABDOMEN: Soft, flat, nontender, nondistended. Has good bowel sounds. No hepatosplenomegaly appreciated.  EXTREMITIES: No evidence of any cyanosis, clubbing, or peripheral edema.  +2 pedal and radial pulses bilaterally.  NEUROLOGIC: The patient is alert, awake, and oriented x3 with no focal motor or sensory deficits appreciated bilaterally.  SKIN: Moist and warm with no rashes appreciated.  Psych: Not anxious, depressed LN: No inguinal LN enlargement    Antibiotics   Anti-infectives    Start     Dose/Rate Route Frequency Ordered Stop   03/15/16 1800  cefTRIAXone (ROCEPHIN) 1 g in dextrose 5 % 50 mL IVPB     1 g 100 mL/hr over 30 Minutes Intravenous Every 24 hours 03/14/16 2238     03/15/16 1000  azithromycin (ZITHROMAX) 500 mg in dextrose 5 % 250 mL IVPB     500 mg 250 mL/hr over 60 Minutes Intravenous Every 24 hours 03/14/16 2158     03/14/16 2015  piperacillin-tazobactam (ZOSYN) IVPB 3.375 g  Status:  Discontinued     3.375 g 100 mL/hr over 30 Minutes Intravenous  Once 03/14/16 2002 03/14/16 2003   03/14/16 2015  cefTRIAXone (ROCEPHIN) 1 g in dextrose 5 % 50 mL IVPB     1 g 100 mL/hr over 30 Minutes Intravenous  Once 03/14/16 2004 03/14/16 2052   03/14/16 2015  azithromycin (ZITHROMAX) 500 mg in dextrose 5 % 250 mL IVPB     500  mg 250 mL/hr over 60 Minutes Intravenous  Once 03/14/16 2004 03/14/16 2151   03/14/16 0000  amoxicillin (AMOXIL) 875 MG tablet     875 mg Oral 2 times daily 03/14/16 1959        Medications   Scheduled Meds: . azithromycin  500 mg Intravenous Q24H  . busPIRone  15 mg Oral BID  . cefTRIAXone (ROCEPHIN)  IV  1 g Intravenous Q24H  . enoxaparin (LOVENOX) injection  40 mg Subcutaneous Q24H  . gabapentin  300 mg Oral BID  . losartan  25 mg Oral Daily  . sodium chloride flush  3 mL Intravenous Q12H   Continuous Infusions:  PRN Meds:.acetaminophen **OR** acetaminophen, guaiFENesin-dextromethorphan, ondansetron **OR** ondansetron (ZOFRAN) IV   Data Review:   Micro Results Recent Results (from the past 240 hour(s))  Rapid Influenza A&B Antigens ([facility] only)     Status: None   Collection Time: 03/14/16  6:35 PM  Result Value Ref Range Status   Influenza A ([place]) NEGATIVE NEGATIVE Final   Influenza B [facility]) NEGATIVE NEGATIVE Final    Radiology Reports Dg Chest 2 View  03/14/2016  CLINICAL DATA:  Sore throat and fever this afternoon. EXAM: CHEST  2 VIEW COMPARISON:  None. FINDINGS: Heart size is upper normal. Overall cardiomediastinal silhouette is within normal limits in size and configuration. Atherosclerotic calcifications noted at the aortic arch. Lungs are hyperexpanded suggesting COPD. Suspect associated chronic bronchitic changes centrally and chronic interstitial lung disease. No evidence of pneumonia. No pleural effusion or pneumothorax seen. Mild degenerative spurring is noted within the kyphotic thoracic spine. No evidence of acute osseous abnormality. IMPRESSION: 1. No acute findings.  No evidence of pneumonia. 2. Hyperexpanded lungs suggesting COPD. Suspect associated chronic bronchitic changes centrally and at least some degree of chronic interstitial lung disease. Electronically Signed   By: Franki Cabot M.D.   On: 03/14/2016 18:53     CBC  Recent Labs Lab 03/14/16  1943  03/15/16 0408  WBC 18.6* 21.7*  HGB 13.0 13.1  HCT 38.6 38.7  PLT 183 181  MCV 89.0 88.6  MCH 30.1 30.0  MCHC 33.8 33.8  RDW 13.6 13.5  LYMPHSABS 0.4*  --   MONOABS 1.4*  --   EOSABS 0.0  --   BASOSABS 0.0  --     Chemistries   Recent Labs Lab 03/14/16 1943 03/15/16 0408  NA 131* 137  K 3.4* 3.7  CL 99* 107  CO2 22 23  GLUCOSE 107* 148*  BUN 11 12  CREATININE 0.74 0.79  CALCIUM 8.9 8.5*  AST 25  --   ALT 18  --   ALKPHOS 95  --   BILITOT 0.5  --    ------------------------------------------------------------------------------------------------------------------ estimated creatinine clearance is 79.1 mL/min (by C-G formula based on Cr of 0.79). ------------------------------------------------------------------------------------------------------------------ No results for input(s): HGBA1C in the last 72 hours. ------------------------------------------------------------------------------------------------------------------ No results for input(s): CHOL, HDL, LDLCALC, TRIG, CHOLHDL, LDLDIRECT in the last 72 hours. ------------------------------------------------------------------------------------------------------------------ No results for input(s): TSH, T4TOTAL, T3FREE, THYROIDAB in the last 72 hours.  Invalid input(s): FREET3 ------------------------------------------------------------------------------------------------------------------ No results for input(s): VITAMINB12, FOLATE, FERRITIN, TIBC, IRON, RETICCTPCT in the last 72 hours.  Coagulation profile No results for input(s): INR, PROTIME in the last 168 hours.  No results for input(s): DDIMER in the last 72 hours.  Cardiac Enzymes No results for input(s): CKMB, TROPONINI, MYOGLOBIN in the last 168 hours.  Invalid input(s): CK ------------------------------------------------------------------------------------------------------------------ Invalid input(s): [place]  Patient is  a 70 year old presenting with fever , cough and hypoxia   1. Acute hypoxia: Suspect due to acute bronchitis  Continues to require 2 L of oxygen  I will add a when necessary nebulizers Mucinex twice a day  2. SIRS : WBC worst today Continue IV Rocephin and azithromycin for Acute bronchitis Follow blood cultures urine cultures PCR negative for the flu Offered patient CT of the chest to further evaluate in light of elevated WBC count she reports that she is claustrophobic and will be unable to go under a CT scan.   3. Generalized anxiety disorder Continue boost bar  4. Hypertension continue Cozaar blood pressure labile will adjust her medications as needed  5.  miscellaneous Lovenox for DVT prophylaxis          Code Status Orders        Start     Ordered   03/14/16 2159  Full code   Continuous     03/14/16 2158    Code Status History    Date Active Date Inactive Code Status Order ID Comments User Context   This patient has a current code status but no historical code status.           Consults none  DVT Prophylaxis  Lovenox   Lab Results  Component Value Date   PLT 181 03/15/2016     Time Spent in minutes   50min Greater than 50% of time spent in care coordination and counseling patient regarding the condition and plan of care.   Dustin Flock M.D on 03/15/2016 at 10:01 AM  Between 7am to 6pm - Pager - [phone]  After 6pm go to [email or web address] - password [email or web address] [place] [facility]   Office  [phone]

## 2016-03-16 ENCOUNTER — Inpatient Hospital Stay: Payer: Medicare Other

## 2016-03-16 ENCOUNTER — Encounter: Payer: Self-pay | Admitting: Radiology

## 2016-03-16 LAB — BASIC METABOLIC PANEL
Anion gap: 3 — ABNORMAL LOW (ref 5–15)
BUN: 13 mg/dL (ref 6–20)
CO2: 25 mmol/L (ref 22–32)
Calcium: 8.2 mg/dL — ABNORMAL LOW (ref 8.9–10.3)
Chloride: 111 mmol/L (ref 101–111)
Creatinine, Ser: 0.62 mg/dL (ref 0.44–1.00)
GFR calc Af Amer: >60 mL/min (ref >60)
GFR calc non Af Amer: >60 mL/min (ref >60)
Glucose, Bld: 123 mg/dL — ABNORMAL HIGH (ref 65–99)
Potassium: 3.7 mmol/L (ref 3.5–5.1)
Sodium: 139 mmol/L (ref 135–145)

## 2016-03-16 LAB — CBC
HCT: 33.7 % — ABNORMAL LOW (ref 35.0–47.0)
Hemoglobin: 11.5 g/dL — ABNORMAL LOW (ref 12.0–16.0)
MCH: 30.8 pg (ref 26.0–34.0)
MCHC: 34.2 g/dL (ref 32.0–36.0)
MCV: 89.9 fL (ref 80.0–100.0)
Platelets: 169 10*3/uL (ref 150–440)
RBC: 3.75 MIL/uL — ABNORMAL LOW (ref 3.80–5.20)
RDW: 14 % (ref 11.5–14.5)
WBC: 20 10*3/uL — ABNORMAL HIGH (ref 3.6–11.0)

## 2016-03-16 LAB — URINE CULTURE

## 2016-03-16 MED ORDER — IOPAMIDOL (ISOVUE-370) INJECTION 76%
75.0000 mL | Freq: Once | INTRAVENOUS | Status: AC | PRN
  Administered 2016-03-16: 75 mL via INTRAVENOUS

## 2016-03-16 MED ORDER — DIAZEPAM 5 MG/ML IJ SOLN
2.0000 mg | Freq: Once | INTRAMUSCULAR | Status: AC
  Administered 2016-03-16: 2 mg via INTRAVENOUS
  Filled 2016-03-16: qty 2

## 2016-03-16 NOTE — Progress Notes (Signed)
[place] at [facility]                                                                                                                                                                                            Patient Demographics   Hannah Greene, is a 70 y.o. female, DOB - 1946-01-22, [id number]  Admit date - 03/14/2016   Admitting Physician Lance Coon, MD  Outpatient Primary MD for the patient is TATE,DENNY C, MD   LOS - 1  Subjective: Patient admitted with cough congestion and hypoxia, high fevers Patient reports some improvement in her symptoms She is still requiring 2 L of oxygen      Review of Systems:   CONSTITUTIONAL: No documented fever. No fatigue, weakness. No weight gain, no weight loss.  EYES: No blurry or double vision.  ENT: No tinnitus. No postnasal drip. No redness of the oropharynx.  RESPIRATORY: Positive cough, no wheeze, no hemoptysis.Positive  dyspnea.  CARDIOVASCULAR: No chest pain. No orthopnea. No palpitations. No syncope.  GASTROINTESTINAL: No nausea, no vomiting or diarrhea. No abdominal pain. No melena or hematochezia.  GENITOURINARY: No dysuria or hematuria.  ENDOCRINE: No polyuria or nocturia. No heat or cold intolerance.  HEMATOLOGY: No anemia. No bruising. No bleeding.  INTEGUMENTARY: No rashes. No lesions.  MUSCULOSKELETAL: No arthritis. No swelling. No gout.  NEUROLOGIC: No numbness, tingling, or ataxia. No seizure-type activity.  PSYCHIATRIC: No anxiety. No insomnia. No ADD.    Vitals:   Filed Vitals:   03/15/16 2014 03/15/16 2344 03/16/16 0459 03/16/16 0736  BP: 154/64 129/55 131/60 151/79  Pulse: 77 66 66 72  Temp: 98.8 F (37.1 C) 98.5 F (36.9 C) 98.2 F (36.8 C) 99 F (37.2 C)  TempSrc: Oral Oral Oral Oral  Resp: 18 18 18 16   Height:      Weight:      SpO2: 98% 99% 98% 99%    Wt Readings from Last 3 Encounters:  03/14/16 84.233 kg (185 lb 11.2 oz)     Intake/Output Summary (Last  24 hours) at 03/16/16 1110 Last data filed at 03/15/16 2144  Gross per 24 hour  Intake   2225 ml  Output      0 ml  Net   2225 ml    Physical Exam:   GENERAL: Pleasant-appearing in no apparent distress.  HEAD, EYES, EARS, NOSE AND THROAT: Atraumatic, normocephalic. Extraocular muscles are intact. Pupils equal and reactive to light. Sclerae anicteric. No conjunctival injection. No oro-pharyngeal erythema.  NECK: Supple. There is no jugular venous distention. No bruits, no lymphadenopathy, no thyromegaly.  HEART: Regular rate and  rhythm,. No murmurs, no rubs, no clicks.  LUNGS:Decreased breath sounds bilaterally without any rales rhonchi wheezing  ABDOMEN: Soft, flat, nontender, nondistended. Has good bowel sounds. No hepatosplenomegaly appreciated.  EXTREMITIES: No evidence of any cyanosis, clubbing, or peripheral edema.  +2 pedal and radial pulses bilaterally.  NEUROLOGIC: The patient is alert, awake, and oriented x3 with no focal motor or sensory deficits appreciated bilaterally.  SKIN: Moist and warm with no rashes appreciated.  Psych: Not anxious, depressed LN: No inguinal LN enlargement    Antibiotics   Anti-infectives    Start     Dose/Rate Route Frequency Ordered Stop   03/15/16 1800  cefTRIAXone (ROCEPHIN) 1 g in dextrose 5 % 50 mL IVPB     1 g 100 mL/hr over 30 Minutes Intravenous Every 24 hours 03/14/16 2238     03/15/16 1000  azithromycin (ZITHROMAX) 500 mg in dextrose 5 % 250 mL IVPB     500 mg 250 mL/hr over 60 Minutes Intravenous Every 24 hours 03/14/16 2158     03/14/16 2015  piperacillin-tazobactam (ZOSYN) IVPB 3.375 g  Status:  Discontinued     3.375 g 100 mL/hr over 30 Minutes Intravenous  Once 03/14/16 2002 03/14/16 2003   03/14/16 2015  cefTRIAXone (ROCEPHIN) 1 g in dextrose 5 % 50 mL IVPB     1 g 100 mL/hr over 30 Minutes Intravenous  Once 03/14/16 2004 03/14/16 2052   03/14/16 2015  azithromycin (ZITHROMAX) 500 mg in dextrose 5 % 250 mL IVPB     500  mg 250 mL/hr over 60 Minutes Intravenous  Once 03/14/16 2004 03/14/16 2151   03/14/16 0000  amoxicillin (AMOXIL) 875 MG tablet     875 mg Oral 2 times daily 03/14/16 1959        Medications   Scheduled Meds: . azithromycin  500 mg Intravenous Q24H  . busPIRone  15 mg Oral BID  . cefTRIAXone (ROCEPHIN)  IV  1 g Intravenous Q24H  . diazepam  2 mg Intravenous Once  . enoxaparin (LOVENOX) injection  40 mg Subcutaneous Q24H  . gabapentin  300 mg Oral BID  . losartan  25 mg Oral Daily  . sodium chloride flush  3 mL Intravenous Q12H   Continuous Infusions:  PRN Meds:.acetaminophen **OR** acetaminophen, guaiFENesin-dextromethorphan, ondansetron **OR** ondansetron (ZOFRAN) IV   Data Review:   Micro Results Recent Results (from the past 240 hour(s))  Rapid Influenza A&B Antigens ([facility] only)     Status: None   Collection Time: 03/14/16  6:35 PM  Result Value Ref Range Status   Influenza A ([place]) NEGATIVE NEGATIVE Final   Influenza B ([facility]) NEGATIVE NEGATIVE Final  Culture, blood (routine x 2)     Status: None (Preliminary result)   Collection Time: 03/14/16  7:43 PM  Result Value Ref Range Status   Specimen Description BLOOD RIGHT FOREARM  Final   Special Requests BOTTLES DRAWN AEROBIC AND ANAEROBIC 3CCAERO,3CCANA  Final   Culture NO GROWTH 2 DAYS  Final   Report Status PENDING  Incomplete  Culture, blood (routine x 2)     Status: None (Preliminary result)   Collection Time: 03/14/16  7:43 PM  Result Value Ref Range Status   Specimen Description BLOOD LEFT ASSIST CONTROL  Final   Special Requests BOTTLES DRAWN AEROBIC AND ANAEROBIC 2CCAERO,2CCANA  Final   Culture NO GROWTH 2 DAYS  Final   Report Status PENDING  Incomplete  Urine culture     Status: None   Collection Time: 03/14/16  9:16 PM  Result Value Ref Range Status   Specimen Description URINE, RANDOM  Final   Special Requests NONE  Final   Culture MULTIPLE SPECIES PRESENT, SUGGEST RECOLLECTION  Final   Report Status  03/16/2016 FINAL  Final  Culture, sputum-assessment     Status: None   Collection Time: 03/15/16 12:24 PM  Result Value Ref Range Status   Specimen Description EXPECTORATED SPUTUM  Final   Special Requests NONE  Final   Sputum evaluation THIS SPECIMEN IS ACCEPTABLE FOR SPUTUM CULTURE  Final   Report Status 03/15/2016 FINAL  Final    Radiology Reports Dg Chest 2 View  03/14/2016  CLINICAL DATA:  Sore throat and fever this afternoon. EXAM: CHEST  2 VIEW COMPARISON:  None. FINDINGS: Heart size is upper normal. Overall cardiomediastinal silhouette is within normal limits in size and configuration. Atherosclerotic calcifications noted at the aortic arch. Lungs are hyperexpanded suggesting COPD. Suspect associated chronic bronchitic changes centrally and chronic interstitial lung disease. No evidence of pneumonia. No pleural effusion or pneumothorax seen. Mild degenerative spurring is noted within the kyphotic thoracic spine. No evidence of acute osseous abnormality. IMPRESSION: 1. No acute findings.  No evidence of pneumonia. 2. Hyperexpanded lungs suggesting COPD. Suspect associated chronic bronchitic changes centrally and at least some degree of chronic interstitial lung disease. Electronically Signed   By: Franki Cabot M.D.   On: 03/14/2016 18:53     CBC  Recent Labs Lab 03/14/16 1943 03/15/16 0408 03/16/16 0336  WBC 18.6* 21.7* 20.0*  HGB 13.0 13.1 11.5*  HCT 38.6 38.7 33.7*  PLT 183 181 169  MCV 89.0 88.6 89.9  MCH 30.1 30.0 30.8  MCHC 33.8 33.8 34.2  RDW 13.6 13.5 14.0  LYMPHSABS 0.4*  --   --   MONOABS 1.4*  --   --   EOSABS 0.0  --   --   BASOSABS 0.0  --   --     Chemistries   Recent Labs Lab 03/14/16 1943 03/15/16 0408 03/16/16 0336  NA 131* 137 139  K 3.4* 3.7 3.7  CL 99* 107 111  CO2 22 23 25   GLUCOSE 107* 148* 123*  BUN 11 12 13   CREATININE 0.74 0.79 0.62  CALCIUM 8.9 8.5* 8.2*  AST 25  --   --   ALT 18  --   --   ALKPHOS 95  --   --   BILITOT 0.5  --    --    ------------------------------------------------------------------------------------------------------------------ estimated creatinine clearance is 79.1 mL/min (by C-G formula based on Cr of 0.62). ------------------------------------------------------------------------------------------------------------------ No results for input(s): HGBA1C in the last 72 hours. ------------------------------------------------------------------------------------------------------------------ No results for input(s): CHOL, HDL, LDLCALC, TRIG, CHOLHDL, LDLDIRECT in the last 72 hours. ------------------------------------------------------------------------------------------------------------------ No results for input(s): TSH, T4TOTAL, T3FREE, THYROIDAB in the last 72 hours.  Invalid input(s): FREET3 ------------------------------------------------------------------------------------------------------------------ No results for input(s): VITAMINB12, FOLATE, FERRITIN, TIBC, IRON, RETICCTPCT in the last 72 hours.  Coagulation profile No results for input(s): INR, PROTIME in the last 168 hours.  No results for input(s): DDIMER in the last 72 hours.  Cardiac Enzymes No results for input(s): CKMB, TROPONINI, MYOGLOBIN in the last 168 hours.  Invalid input(s): CK ------------------------------------------------------------------------------------------------------------------ Invalid input(s): [place]  Patient is a 70 year old presenting with fever , cough and hypoxia   1. Acute hypoxia: Suspect due to acute bronchitis  Continues to require 2 L of oxygen  I will add a when necessary nebulizers Mucinex twice a day  2. SIRS :  WBC worst today Continue IV Rocephin and azithromycin for Acute bronchitis Follow blood cultures urine cultures PCR negative for the flu We'll obtain a CT scan of the chest to further evaluate her persistent leukocytosis  3. Generalized anxiety  disorder Continue  buspar  4. Hypertension continue Cozaar blood pressure labile will adjust her medications as needed  5.  miscellaneous Lovenox for DVT prophylaxis          Code Status Orders        Start     Ordered   03/14/16 2159  Full code   Continuous     03/14/16 2158    Code Status History    Date Active Date Inactive Code Status Order ID Comments User Context   This patient has a current code status but no historical code status.           Consults none  DVT Prophylaxis  Lovenox   Lab Results  Component Value Date   PLT 169 03/16/2016     Time Spent in minutes   57min Greater than 50% of time spent in care coordination and counseling patient regarding the condition and plan of care.   Dustin Flock M.D on 03/16/2016 at 11:10 AM  Between 7am to 6pm - Pager - [phone]  After 6pm go to [email or web address] - password [email or web address] [place] [facility]   Office  [phone]

## 2016-03-17 LAB — CBC WITH DIFFERENTIAL/PLATELET
Basophils Absolute: 0 10*3/uL (ref 0–0.1)
Basophils Relative: 0 %
Eosinophils Absolute: 0.1 10*3/uL (ref 0–0.7)
Eosinophils Relative: 1 %
HCT: 35.9 % (ref 35.0–47.0)
Hemoglobin: 12.3 g/dL (ref 12.0–16.0)
Lymphocytes Relative: 9 %
Lymphs Abs: 1.1 10*3/uL (ref 1.0–3.6)
MCH: 30.4 pg (ref 26.0–34.0)
MCHC: 34.3 g/dL (ref 32.0–36.0)
MCV: 88.4 fL (ref 80.0–100.0)
Monocytes Absolute: 0.6 10*3/uL (ref 0.2–0.9)
Monocytes Relative: 5 %
Neutro Abs: 9.7 10*3/uL — ABNORMAL HIGH (ref 1.4–6.5)
Neutrophils Relative %: 85 %
Platelets: 207 10*3/uL (ref 150–440)
RBC: 4.07 MIL/uL (ref 3.80–5.20)
RDW: 13.6 % (ref 11.5–14.5)
WBC: 11.4 10*3/uL — ABNORMAL HIGH (ref 3.6–11.0)

## 2016-03-17 MED ORDER — ZOLPIDEM TARTRATE 5 MG PO TABS
5.0000 mg | ORAL_TABLET | Freq: Every evening | ORAL | Status: DC | PRN
  Administered 2016-03-17: 5 mg via ORAL
  Filled 2016-03-17: qty 1

## 2016-03-17 MED ORDER — LABETALOL HCL 5 MG/ML IV SOLN
10.0000 mg | INTRAVENOUS | Status: DC | PRN
  Filled 2016-03-17: qty 4

## 2016-03-17 NOTE — Evaluation (Signed)
Clinical/Bedside Swallow Evaluation Patient Details  Name: Hannah Greene MRN: [id number] Date of Birth: May 17, 1946  Today's Date: 03/17/2016 Time: SLP Start Time (ACUTE ONLY): 71 SLP Stop Time (ACUTE ONLY): 1627 SLP Time Calculation (min) (ACUTE ONLY): 35 min  Past Medical History:  Past Medical History  Diagnosis Date  . Cancer ([place])     skin  . Osteoarthritis   . DDD (degenerative disc disease), lumbar   . Anxiety   . Hypertension    Past Surgical History:  Past Surgical History  Procedure Laterality Date  . Tonsillectomy    . Skin mole excision     . Uterine polyps     . Breast biopsy Left 2007   HPI:  Pt has recent onset of pneumonia. pt states history of choking on medication that has resolved and now she states that she has a "mental block" when taking medications, she and nursing state no difficulty with food, liquid or medications.    Assessment / Plan / Recommendation Clinical Impression  pt presents with within normal limits for swallowing with solids and liquids. pt had no overt ssx aspiration noted with puree, soft solid and solid bolus trials. pt with no overt ssx aspiration with ice chips or thin liquids. pt reports that she stated to RN that she has choked on pills in the past. pt states this has caused a "mental block" when taking medication but is able to break large pills and no longer has problems. pt was observed directly with po's and was not noted to have any difficulty with medications or solids/ liquids at this time. ST educated the pt to notify nursing and speech if any difficulty arises. EVAL only at this time.     Aspiration Risk  No limitations    Diet Recommendation Regular;Thin liquid   Liquid Administration via: Straw;Cup Medication Administration: Whole meds with liquid Supervision: Patient able to self feed Postural Changes: Seated upright at 90 degrees    Other  Recommendations     Follow up Recommendations  None    Frequency and  Duration  NA evaluation only          Prognosis Prognosis for Safe Diet Advancement: Good      Swallow Study   General Date of Onset: 03/17/16 HPI: Pt has recent onset of pneumonia. pt states history of choking on medication that has resolved and now she states that she has a "mental block" when taking medications, she and nursing state no difficulty with food, liquid or medications.  Type of Study: Bedside Swallow Evaluation Diet Prior to this Study: Regular;Thin liquids Temperature Spikes Noted: No Respiratory Status: Room air History of Recent Intubation: No Behavior/Cognition: Alert;Cooperative;Pleasant mood Oral Care Completed by SLP: No Oral Cavity - Dentition: Adequate natural dentition Vision: Functional for self-feeding Self-Feeding Abilities: Able to feed self Patient Positioning: Upright in bed Baseline Vocal Quality: Normal Volitional Cough: Strong Volitional Swallow: Able to elicit    Oral/Motor/Sensory Function Overall Oral Motor/Sensory Function: Within functional limits   Ice Chips Ice chips: Within functional limits Presentation: Self Fed;Spoon   Thin Liquid Thin Liquid: Within functional limits Presentation: Cup;Self Fed;Spoon;Straw    Nectar Thick Nectar Thick Liquid: Not tested   Honey Thick Honey Thick Liquid: Not tested   Puree Puree: Within functional limits Presentation: Self Fed;Spoon   Solid   GO   Solid: Within functional limits Presentation: Self Fed;Spoon        [place] Fae Pippin 03/17/2016,5:42 PM

## 2016-03-17 NOTE — Progress Notes (Signed)
Pharmacist - Prescriber Communication  Per [place], the order for diphenhydramine 50 mg po at bedtime PRN sleep has been changed to zolpidem 5 mg for patient over 68 years old. Please call pharmacy if you have any questions about this substitution.  Laural Benes, Pharm.D., BCPS Clinical Pharmacist 03/17/2016 2127

## 2016-03-17 NOTE — Progress Notes (Signed)
Pharmacy Antibiotic Note/ Day 3   Hannah Greene is a 70 y.o. female admitted on 03/14/2016 with CAP.  Pharmacy consulted to dose and monitor rocephin   Plan: Ceftriaxone 1 gram q 24 hours ordered. Patient also on azithromycin 500 mg daily.  Height: 5\' 9"  (175.3 cm) Weight: 185 lb 11.2 oz (84.233 kg) IBW/kg (Calculated) : 66.2  Temp (24hrs), Avg:98.3 F (36.8 C), Min:98.1 F (36.7 C), Max:98.6 F (37 C)   Recent Labs Lab 03/14/16 1943 03/15/16 0408 03/16/16 0336  WBC 18.6* 21.7* 20.0*  CREATININE 0.74 0.79 0.62  LATICACIDVEN 1.1  --   --     Estimated Creatinine Clearance: 79.1 mL/min (by C-G formula based on Cr of 0.62).    Allergies  Allergen Reactions  . Sulfa Antibiotics Hives    Antimicrobials this admission: ceftriaxone azithromycin >>    >>   Dose adjustments this admission:   Microbiology results: 4/8 BCx: pending 4/8 UCx: pending  4/8 Sputum: pending  4/8 UA: (-)  Thank you for allowing pharmacy to be a part of this patient's care.  Cyan Moultrie D 03/17/2016 10:22 AM

## 2016-03-17 NOTE — Progress Notes (Signed)
[place] [facility]                                                                                                                                                                                            Patient Demographics   Hannah Greene, is a 70 y.o. female, DOB - September 05, 1946, [id number]  Admit date - 03/14/2016   Admitting Physician Lance Coon, MD  Outpatient Primary MD for the patient is TATE,DENNY C, MD   LOS - 2  Subjective:  Patient had ct of chest which showed B/l pna with mucos plugging      Review of Systems:   CONSTITUTIONAL: No documented fever. No fatigue, weakness. No weight gain, no weight loss.  EYES: No blurry or double vision.  ENT: No tinnitus. No postnasal drip. No redness of the oropharynx.  RESPIRATORY: Positive cough, no wheeze, no hemoptysis.Positive  dyspnea.  CARDIOVASCULAR: No chest pain. No orthopnea. No palpitations. No syncope.  GASTROINTESTINAL: No nausea, no vomiting or diarrhea. No abdominal pain. No melena or hematochezia.  GENITOURINARY: No dysuria or hematuria.  ENDOCRINE: No polyuria or nocturia. No heat or cold intolerance.  HEMATOLOGY: No anemia. No bruising. No bleeding.  INTEGUMENTARY: No rashes. No lesions.  MUSCULOSKELETAL: No arthritis. No swelling. No gout.  NEUROLOGIC: No numbness, tingling, or ataxia. No seizure-type activity.  PSYCHIATRIC: No anxiety. No insomnia. No ADD.    Vitals:   Filed Vitals:   03/16/16 2352 03/17/16 0342 03/17/16 0815 03/17/16 0817  BP: 153/86 146/65 170/75 162/70  Pulse: 62 63 69 70  Temp: 98.2 F (36.8 C) 98.6 F (37 C) 98.1 F (36.7 C)   TempSrc: Oral Oral Oral   Resp:  18 18   Height:      Weight:      SpO2: 99% 97% 98% 96%    Wt Readings from Last 3 Encounters:  03/14/16 84.233 kg (185 lb 11.2 oz)     Intake/Output Summary (Last 24 hours) at 03/17/16 1018 Last data filed at 03/17/16 0932  Gross per 24 hour  Intake    243 ml  Output       0 ml  Net    243 ml    Physical Exam:   GENERAL: Pleasant-appearing in no apparent distress.  HEAD, EYES, EARS, NOSE AND THROAT: Atraumatic, normocephalic. Extraocular muscles are intact. Pupils equal and reactive to light. Sclerae anicteric. No conjunctival injection. No oro-pharyngeal erythema.  NECK: Supple. There is no jugular venous distention. No bruits, no lymphadenopathy, no thyromegaly.  HEART: Regular rate and rhythm,. No murmurs, no rubs, no clicks.  LUNGS : b/l ronchi  in bases, no wheezing or ronchi ABDOMEN: Soft, flat, nontender, nondistended. Has good bowel sounds. No hepatosplenomegaly appreciated.  EXTREMITIES: No evidence of any cyanosis, clubbing, or peripheral edema.  +2 pedal and radial pulses bilaterally.  NEUROLOGIC: The patient is alert, awake, and oriented x3 with no focal motor or sensory deficits appreciated bilaterally.  SKIN: Moist and warm with no rashes appreciated.  Psych: Not anxious, depressed LN: No inguinal LN enlargement    Antibiotics   Anti-infectives    Start     Dose/Rate Route Frequency Ordered Stop   03/15/16 1800  cefTRIAXone (ROCEPHIN) 1 g in dextrose 5 % 50 mL IVPB     1 g 100 mL/hr over 30 Minutes Intravenous Every 24 hours 03/14/16 2238     03/15/16 1000  azithromycin (ZITHROMAX) 500 mg in dextrose 5 % 250 mL IVPB     500 mg 250 mL/hr over 60 Minutes Intravenous Every 24 hours 03/14/16 2158     03/14/16 2015  piperacillin-tazobactam (ZOSYN) IVPB 3.375 g  Status:  Discontinued     3.375 g 100 mL/hr over 30 Minutes Intravenous  Once 03/14/16 2002 03/14/16 2003   03/14/16 2015  cefTRIAXone (ROCEPHIN) 1 g in dextrose 5 % 50 mL IVPB     1 g 100 mL/hr over 30 Minutes Intravenous  Once 03/14/16 2004 03/14/16 2052   03/14/16 2015  azithromycin (ZITHROMAX) 500 mg in dextrose 5 % 250 mL IVPB     500 mg 250 mL/hr over 60 Minutes Intravenous  Once 03/14/16 2004 03/14/16 2151   03/14/16 0000  amoxicillin (AMOXIL) 875 MG tablet     875 mg  Oral 2 times daily 03/14/16 1959        Medications   Scheduled Meds: . azithromycin  500 mg Intravenous Q24H  . busPIRone  15 mg Oral BID  . cefTRIAXone (ROCEPHIN)  IV  1 g Intravenous Q24H  . enoxaparin (LOVENOX) injection  40 mg Subcutaneous Q24H  . gabapentin  300 mg Oral BID  . losartan  25 mg Oral Daily  . sodium chloride flush  3 mL Intravenous Q12H   Continuous Infusions:  PRN Meds:.acetaminophen **OR** acetaminophen, guaiFENesin-dextromethorphan, ondansetron **OR** ondansetron (ZOFRAN) IV   Data Review:   Micro Results Recent Results (from the past 240 hour(s))  Rapid Influenza A&B Antigens ([facility] only)     Status: None   Collection Time: 03/14/16  6:35 PM  Result Value Ref Range Status   Influenza A ([place]) NEGATIVE NEGATIVE Final   Influenza B ([facility]) NEGATIVE NEGATIVE Final  Culture, blood (routine x 2)     Status: None (Preliminary result)   Collection Time: 03/14/16  7:43 PM  Result Value Ref Range Status   Specimen Description BLOOD RIGHT FOREARM  Final   Special Requests BOTTLES DRAWN AEROBIC AND ANAEROBIC 3CCAERO,3CCANA  Final   Culture NO GROWTH 2 DAYS  Final   Report Status PENDING  Incomplete  Culture, blood (routine x 2)     Status: None (Preliminary result)   Collection Time: 03/14/16  7:43 PM  Result Value Ref Range Status   Specimen Description BLOOD LEFT ASSIST CONTROL  Final   Special Requests BOTTLES DRAWN AEROBIC AND ANAEROBIC 2CCAERO,2CCANA  Final   Culture NO GROWTH 2 DAYS  Final   Report Status PENDING  Incomplete  Urine culture     Status: None   Collection Time: 03/14/16  9:16 PM  Result Value Ref Range Status   Specimen Description URINE, RANDOM  Final   Special Requests  NONE  Final   Culture MULTIPLE SPECIES PRESENT, SUGGEST RECOLLECTION  Final   Report Status 03/16/2016 FINAL  Final  Culture, sputum-assessment     Status: None   Collection Time: 03/15/16 12:24 PM  Result Value Ref Range Status   Specimen Description EXPECTORATED  SPUTUM  Final   Special Requests NONE  Final   Sputum evaluation THIS SPECIMEN IS ACCEPTABLE FOR SPUTUM CULTURE  Final   Report Status 03/15/2016 FINAL  Final  Culture, respiratory (NON-Expectorated)     Status: None (Preliminary result)   Collection Time: 03/15/16 12:24 PM  Result Value Ref Range Status   Specimen Description EXPECTORATED SPUTUM  Final   Special Requests NONE Reflexed from [id number]  Final   Gram Stain PENDING  Incomplete   Culture TOO YOUNG TO READ  Final   Report Status PENDING  Incomplete    Radiology Reports Dg Chest 2 View  03/14/2016  CLINICAL DATA:  Sore throat and fever this afternoon. EXAM: CHEST  2 VIEW COMPARISON:  None. FINDINGS: Heart size is upper normal. Overall cardiomediastinal silhouette is within normal limits in size and configuration. Atherosclerotic calcifications noted at the aortic arch. Lungs are hyperexpanded suggesting COPD. Suspect associated chronic bronchitic changes centrally and chronic interstitial lung disease. No evidence of pneumonia. No pleural effusion or pneumothorax seen. Mild degenerative spurring is noted within the kyphotic thoracic spine. No evidence of acute osseous abnormality. IMPRESSION: 1. No acute findings.  No evidence of pneumonia. 2. Hyperexpanded lungs suggesting COPD. Suspect associated chronic bronchitic changes centrally and at least some degree of chronic interstitial lung disease. Electronically Signed   By: Franki Cabot M.D.   On: 03/14/2016 18:53   Ct Angio Chest Pe W/cm &/or Wo Cm  03/16/2016  CLINICAL DATA:  Fever, cough, congestion past 2 days.  Delays. EXAM: CT ANGIOGRAPHY CHEST WITH CONTRAST TECHNIQUE: Multidetector CT imaging of the chest was performed using the standard protocol during bolus administration of intravenous contrast. Multiplanar CT image reconstructions and MIPs were obtained to evaluate the vascular anatomy. CONTRAST:  75 cc Isovue 370 IV COMPARISON:  Chest x-ray 03/14/2016 FINDINGS:  Mediastinum/Nodes: No filling defects in the pulmonary arteries to suggest pulmonary emboli. Heart is normal size. Aorta is normal caliber. No mediastinal, hilar, or axillary adenopathy. Lungs/Pleura: There is hyperinflation of the lungs compatible with COPD. There is mucous plugging within the airways of the lower lobes bilaterally. Ground-glass airspace opacities noted in both lower lobes most compatible with pneumonia. No pleural effusions. Scarring noted in the apices bilaterally. Upper abdomen: Imaging into the upper abdomen shows no acute findings. Musculoskeletal: No acute bony abnormality. Chest wall soft tissues are unremarkable. Review of the MIP images confirms the above findings. IMPRESSION: Mucous plugging within the lower lobe airways bilaterally. Associated bilateral lower lobe ground-glass areas of consolidation concerning for bilateral pneumonia. No evidence of pulmonary embolus. Mild hyperinflation/COPD. Electronically Signed   By: Rolm Baptise M.D.   On: 03/16/2016 12:19     CBC  Recent Labs Lab 03/14/16 1943 03/15/16 0408 03/16/16 0336  WBC 18.6* 21.7* 20.0*  HGB 13.0 13.1 11.5*  HCT 38.6 38.7 33.7*  PLT 183 181 169  MCV 89.0 88.6 89.9  MCH 30.1 30.0 30.8  MCHC 33.8 33.8 34.2  RDW 13.6 13.5 14.0  LYMPHSABS 0.4*  --   --   MONOABS 1.4*  --   --   EOSABS 0.0  --   --   BASOSABS 0.0  --   --     Chemistries  Recent Labs Lab 03/14/16 1943 03/15/16 0408 03/16/16 0336  NA 131* 137 139  K 3.4* 3.7 3.7  CL 99* 107 111  CO2 22 23 25   GLUCOSE 107* 148* 123*  BUN 11 12 13   CREATININE 0.74 0.79 0.62  CALCIUM 8.9 8.5* 8.2*  AST 25  --   --   ALT 18  --   --   ALKPHOS 95  --   --   BILITOT 0.5  --   --    ------------------------------------------------------------------------------------------------------------------ estimated creatinine clearance is 79.1 mL/min (by C-G formula based on Cr of  0.62). ------------------------------------------------------------------------------------------------------------------ No results for input(s): HGBA1C in the last 72 hours. ------------------------------------------------------------------------------------------------------------------ No results for input(s): CHOL, HDL, LDLCALC, TRIG, CHOLHDL, LDLDIRECT in the last 72 hours. ------------------------------------------------------------------------------------------------------------------ No results for input(s): TSH, T4TOTAL, T3FREE, THYROIDAB in the last 72 hours.  Invalid input(s): FREET3 ------------------------------------------------------------------------------------------------------------------ No results for input(s): VITAMINB12, FOLATE, FERRITIN, TIBC, IRON, RETICCTPCT in the last 72 hours.  Coagulation profile No results for input(s): INR, PROTIME in the last 168 hours.  No results for input(s): DDIMER in the last 72 hours.  Cardiac Enzymes No results for input(s): CKMB, TROPONINI, MYOGLOBIN in the last 168 hours.  Invalid input(s): CK ------------------------------------------------------------------------------------------------------------------ Invalid input(s): [place]  Patient is a 70 year old presenting with fever , cough and hypoxia   1. Acute hypoxia: Due to bilateral pneumonia Add incentive spirometer to her regimen I will add a when necessary nebulizers Mucinex twice a day Continue Rocephin and azithromycin  2. SIRS : WBC worst today Continue IV Rocephin and azithromycin for pneumonia Follow blood cultures urine cultures PCR negative for the flu  3. Generalized anxiety disorder Continue  buspar  4. Hypertension continue Cozaar blood pressure labile will adjust her medications as needed  5.  miscellaneous Lovenox for DVT prophylaxis    Anticipate discharge tomorrow      Code Status Orders        Start     Ordered    03/14/16 2159  Full code   Continuous     03/14/16 2158    Code Status History    Date Active Date Inactive Code Status Order ID Comments User Context   This patient has a current code status but no historical code status.           Consults none  DVT Prophylaxis  Lovenox   Lab Results  Component Value Date   PLT 169 03/16/2016     Time Spent in minutes   42min Greater than 50% of time spent in care coordination and counseling patient regarding the condition and plan of care.   Dustin Flock M.D on 03/17/2016 at 10:18 AM  Between 7am to 6pm - Pager - [phone]  After 6pm go to [email or web address] - password [email or web address]   Office  [phone]

## 2016-03-17 NOTE — Progress Notes (Signed)
Patients expresses concerns about BP. BP currently WNL . Patient states that she cannot sleep and takes sleep aid at home. Not on home med list. Prime doc paged. Dr. Jannifer Franklin placing new orders.

## 2016-03-17 NOTE — Care Management Important Message (Signed)
Important Message  Patient Details  Name: Hannah Greene MRN: [id number] Date of Birth: 07/04/1946   Medicare Important Message Given:  Yes    [name] 03/17/2016, 12:29 PM

## 2016-03-17 NOTE — Progress Notes (Addendum)
Patient has just spoken with Dr. Posey Pronto regarding results of CT scan-bilateral pneumonia and changes of possible COPD.  After he left the room, she reports episodes of choking when taking medications.  RN reported this to Dr. Posey Pronto.  He agreed to order a speech consult to determine if she needs a swallow study.  Per his request, patient has also been given an incentive spirometry and instructed on hourly use.  She performed 10 breaths and voiced understanding of instructions.

## 2016-03-18 LAB — CBC
HCT: 35.7 % (ref 35.0–47.0)
Hemoglobin: 12.5 g/dL (ref 12.0–16.0)
MCH: 31.1 pg (ref 26.0–34.0)
MCHC: 34.9 g/dL (ref 32.0–36.0)
MCV: 89 fL (ref 80.0–100.0)
Platelets: 219 10*3/uL (ref 150–440)
RBC: 4.01 MIL/uL (ref 3.80–5.20)
RDW: 13.5 % (ref 11.5–14.5)
WBC: 8.8 10*3/uL (ref 3.6–11.0)

## 2016-03-18 MED ORDER — CEFUROXIME AXETIL 500 MG PO TABS: 500.0000 mg | ORAL_TABLET | Freq: Two times a day (BID) | ORAL | Status: AC

## 2016-03-18 MED ORDER — GUAIFENESIN-DM 100-10 MG/5ML PO SYRP: 5.0000 mL | ORAL_SOLUTION | ORAL | Status: DC | PRN

## 2016-03-18 MED ORDER — LOSARTAN POTASSIUM 50 MG PO TABS
25.0000 mg | ORAL_TABLET | Freq: Once | ORAL | Status: AC
  Administered 2016-03-18: 25 mg via ORAL
  Filled 2016-03-18: qty 1

## 2016-03-18 MED ORDER — LOSARTAN POTASSIUM 50 MG PO TABS: 50.0000 mg | ORAL_TABLET | Freq: Every day | ORAL | Status: DC

## 2016-03-18 MED ORDER — AZITHROMYCIN 250 MG PO TABS: 500.0000 mg | ORAL_TABLET | Freq: Every day | ORAL | Status: DC

## 2016-03-18 MED ORDER — HYDRALAZINE HCL 20 MG/ML IJ SOLN
10.0000 mg | Freq: Once | INTRAMUSCULAR | Status: AC
  Administered 2016-03-18: 10 mg via INTRAVENOUS
  Filled 2016-03-18: qty 1

## 2016-03-18 NOTE — Discharge Instructions (Signed)

## 2016-03-18 NOTE — Discharge Summary (Signed)
Hannah Greene, 70 y.o., DOB 09-Dec-1945, MRN [id number]. Admission date: 03/14/2016 Discharge Date 03/18/2016 Primary MD Albina Billet, MD Admitting Physician Lance Coon, MD  Admission Diagnosis  Community acquired pneumonia [J18.9] Acute respiratory failure with hypoxia ([place]) [J96.01] Influenza-like illness [J11.1] Sepsis, due to unspecified organism [facility]) [A41.9]  Discharge Diagnosis   Principal Problem: Acute hypoxia due to bilateral pneumonia community-acquired   SIRS (systemic inflammatory response syndrome) ([place])  Flu-like symptoms with negative PCR for flu   Anxiety   Osteoarthritis  Essential hypertension        Hospital CourseBrenda Greene is a 70 y.o. female who presents with Fever, cough, malaise. Patient states that about a week ago she began having some symptoms of congestion and cough. This progressed for couple of days, patient has some improvement and worsening. Came to the ED with shortness of breath cough WBC count that was very high. Initial chest x-ray was negative however CT scan showed bilateral pneumonia. Patient was treated with antibiotics and mucolytics with slow improvement in her symptoms. Patient's breathing is much improved she is currently on room air. In doing much better. Her blood pressure was also elevated during hospitalization and her medication had to be adjusted.           Consults  None  Significant Tests:  See full reports for all details      Dg Chest 2 View  03/14/2016  CLINICAL DATA:  Sore throat and fever this afternoon. EXAM: CHEST  2 VIEW COMPARISON:  None. FINDINGS: Heart size is upper normal. Overall cardiomediastinal silhouette is within normal limits in size and configuration. Atherosclerotic calcifications noted at the aortic arch. Lungs are hyperexpanded suggesting COPD. Suspect associated chronic bronchitic changes centrally and chronic interstitial lung disease. No evidence of pneumonia. No pleural effusion or pneumothorax  seen. Mild degenerative spurring is noted within the kyphotic thoracic spine. No evidence of acute osseous abnormality. IMPRESSION: 1. No acute findings.  No evidence of pneumonia. 2. Hyperexpanded lungs suggesting COPD. Suspect associated chronic bronchitic changes centrally and at least some degree of chronic interstitial lung disease. Electronically Signed   By: Franki Cabot M.D.   On: 03/14/2016 18:53   Ct Angio Chest Pe W/cm &/or Wo Cm  03/16/2016  CLINICAL DATA:  Fever, cough, congestion past 2 days.  Delays. EXAM: CT ANGIOGRAPHY CHEST WITH CONTRAST TECHNIQUE: Multidetector CT imaging of the chest was performed using the standard protocol during bolus administration of intravenous contrast. Multiplanar CT image reconstructions and MIPs were obtained to evaluate the vascular anatomy. CONTRAST:  75 cc Isovue 370 IV COMPARISON:  Chest x-ray 03/14/2016 FINDINGS: Mediastinum/Nodes: No filling defects in the pulmonary arteries to suggest pulmonary emboli. Heart is normal size. Aorta is normal caliber. No mediastinal, hilar, or axillary adenopathy. Lungs/Pleura: There is hyperinflation of the lungs compatible with COPD. There is mucous plugging within the airways of the lower lobes bilaterally. Ground-glass airspace opacities noted in both lower lobes most compatible with pneumonia. No pleural effusions. Scarring noted in the apices bilaterally. Upper abdomen: Imaging into the upper abdomen shows no acute findings. Musculoskeletal: No acute bony abnormality. Chest wall soft tissues are unremarkable. Review of the MIP images confirms the above findings. IMPRESSION: Mucous plugging within the lower lobe airways bilaterally. Associated bilateral lower lobe ground-glass areas of consolidation concerning for bilateral pneumonia. No evidence of pulmonary embolus. Mild hyperinflation/COPD. Electronically Signed   By: Rolm Baptise M.D.   On: 03/16/2016 12:19       Today   Subjective:  Hannah Greene  Feels well  breathing improved  Objective:   Blood pressure 150/79, pulse 79, temperature 98.3 F (36.8 C), temperature source Oral, resp. rate 18, height 5\' 9"  (1.753 m), weight 84.233 kg (185 lb 11.2 oz), SpO2 94 %.  .  Intake/Output Summary (Last 24 hours) at 03/18/16 1355 Last data filed at 03/18/16 0942  Gross per 24 hour  Intake    960 ml  Output      1 ml  Net    959 ml    Exam VITAL SIGNS: Blood pressure 150/79, pulse 79, temperature 98.3 F (36.8 C), temperature source Oral, resp. rate 18, height 5\' 9"  (1.753 m), weight 84.233 kg (185 lb 11.2 oz), SpO2 94 %.  GENERAL:  70 y.o.-year-old patient lying in the bed with no acute distress.  EYES: Pupils equal, round, reactive to light and accommodation. No scleral icterus. Extraocular muscles intact.  HEENT: Head atraumatic, normocephalic. Oropharynx and nasopharynx clear.  NECK:  Supple, no jugular venous distention. No thyroid enlargement, no tenderness.  LUNGS: Normal breath sounds bilaterally, no wheezing, rales,rhonchi or crepitation. No use of accessory muscles of respiration.  CARDIOVASCULAR: S1, S2 normal. No murmurs, rubs, or gallops.  ABDOMEN: Soft, nontender, nondistended. Bowel sounds present. No organomegaly or mass.  EXTREMITIES: No pedal edema, cyanosis, or clubbing.  NEUROLOGIC: Cranial nerves II through XII are intact. Muscle strength 5/5 in all extremities. Sensation intact. Gait not checked.  PSYCHIATRIC: The patient is alert and oriented x 3.  SKIN: No obvious rash, lesion, or ulcer.   Data Review     CBC w Diff: Lab Results  Component Value Date   WBC 8.8 03/18/2016   HGB 12.5 03/18/2016   HCT 35.7 03/18/2016   PLT 219 03/18/2016   LYMPHOPCT 9 03/17/2016   MONOPCT 5 03/17/2016   EOSPCT 1 03/17/2016   BASOPCT 0 03/17/2016   CMP: Lab Results  Component Value Date   NA 139 03/16/2016   K 3.7 03/16/2016   CL 111 03/16/2016   CO2 25 03/16/2016   BUN 13 03/16/2016   CREATININE 0.62 03/16/2016   PROT 7.4  03/14/2016   ALBUMIN 4.1 03/14/2016   BILITOT 0.5 03/14/2016   ALKPHOS 95 03/14/2016   AST 25 03/14/2016   ALT 18 03/14/2016  .  Micro Results Recent Results (from the past 240 hour(s))  Rapid Influenza A&B Antigens ([facility] only)     Status: None   Collection Time: 03/14/16  6:35 PM  Result Value Ref Range Status   Influenza A ([place]) NEGATIVE NEGATIVE Final   Influenza B ([facility]) NEGATIVE NEGATIVE Final  Culture, blood (routine x 2)     Status: None (Preliminary result)   Collection Time: 03/14/16  7:43 PM  Result Value Ref Range Status   Specimen Description BLOOD RIGHT FOREARM  Final   Special Requests BOTTLES DRAWN AEROBIC AND ANAEROBIC 3CCAERO,3CCANA  Final   Culture NO GROWTH 2 DAYS  Final   Report Status PENDING  Incomplete  Culture, blood (routine x 2)     Status: None (Preliminary result)   Collection Time: 03/14/16  7:43 PM  Result Value Ref Range Status   Specimen Description BLOOD LEFT ASSIST CONTROL  Final   Special Requests BOTTLES DRAWN AEROBIC AND ANAEROBIC 2CCAERO,2CCANA  Final   Culture NO GROWTH 2 DAYS  Final   Report Status PENDING  Incomplete  Urine culture     Status: None   Collection Time: 03/14/16  9:16 PM  Result Value Ref Range Status  Specimen Description URINE, RANDOM  Final   Special Requests NONE  Final   Culture MULTIPLE SPECIES PRESENT, SUGGEST RECOLLECTION  Final   Report Status 03/16/2016 FINAL  Final  Culture, sputum-assessment     Status: None   Collection Time: 03/15/16 12:24 PM  Result Value Ref Range Status   Specimen Description EXPECTORATED SPUTUM  Final   Special Requests NONE  Final   Sputum evaluation THIS SPECIMEN IS ACCEPTABLE FOR SPUTUM CULTURE  Final   Report Status 03/15/2016 FINAL  Final  Culture, respiratory (NON-Expectorated)     Status: None (Preliminary result)   Collection Time: 03/15/16 12:24 PM  Result Value Ref Range Status   Specimen Description EXPECTORATED SPUTUM  Final   Special Requests NONE Reflexed from  [id number]  Final   Gram Stain   Final    MANY WBC SEEN RARE SQUAMOUS EPITHELIAL CELLS PRESENT MODERATE GRAM POSITIVE COCCI RARE GRAM NEGATIVE RODS    Culture   Final    MODERATE GROWTH STREPTOCOCCUS PNEUMONIAE SUSCEPTIBILITIES TO FOLLOW    Report Status PENDING  Incomplete        Code Status Orders        Start     Ordered   03/14/16 2159  Full code   Continuous     03/14/16 2158    Code Status History    Date Active Date Inactive Code Status Order ID Comments User Context   This patient has a current code status but no historical code status.          Follow-up Information    Follow up with Albina Billet, MD On 03/25/2016.   Specialty:  Internal Medicine   Why:  Appointment is at 10:45   Contact information:   [address] [place] [address] [phone]       Discharge Medications     Medication List    TAKE these medications        acetaminophen 500 MG tablet  Commonly known as:  TYLENOL  Take 500 mg by mouth at bedtime.     busPIRone 15 MG tablet  Commonly known as:  BUSPAR  Take 15 mg by mouth 2 (two) times daily.     cefUROXime 500 MG tablet  Commonly known as:  CEFTIN  Take 1 tablet (500 mg total) by mouth 2 (two) times daily with a meal.     COSAMIN DS 500-400 MG Caps  Generic drug:  Glucosamine-Chondroitin  Take 1 capsule by mouth 2 (two) times daily.     gabapentin 300 MG capsule  Commonly known as:  NEURONTIN  Take 300 mg by mouth 2 (two) times daily.     guaiFENesin-dextromethorphan 100-10 MG/5ML syrup  Commonly known as:  ROBITUSSIN DM  Take 5 mLs by mouth every 4 (four) hours as needed for cough.     losartan 50 MG tablet  Commonly known as:  COZAAR  Take 1 tablet (50 mg total) by mouth daily.  Start taking on:  03/19/2016     naproxen sodium 220 MG tablet  Commonly known as:  ANAPROX  Take 440 mg by mouth every morning.     Red Yeast Rice 600 MG Caps  Take 1 capsule by mouth 2 (two) times daily.     vitamin  C 500 MG tablet  Commonly known as:  ASCORBIC ACID  Take 500 mg by mouth 2 (two) times daily.           Total Time in preparing  paper work, data evaluation and todays exam - 35 minutes  Dustin Flock M.D on 03/18/2016 at 1:55 PM  [place]  [phone]

## 2016-03-18 NOTE — Progress Notes (Signed)
PHARMACIST - PHYSICIAN COMMUNICATION DR:   Posey Pronto CONCERNING: Antibiotic IV to Oral Route Change Policy  RECOMMENDATION: This patient is receiving Azithromycin by the intravenous route.  Based on criteria approved by the Pharmacy and Therapeutics Committee, the antibiotic(s) is/are being converted to the equivalent oral dose form(s).   DESCRIPTION: These criteria include:  Patient being treated for a respiratory tract infection, urinary tract infection, cellulitis or clostridium difficile associated diarrhea if on metronidazole  The patient is not neutropenic and does not exhibit a GI malabsorption state  The patient is eating (either orally or via tube) and/or has been taking other orally administered medications for a least 24 hours  The patient is improving clinically and has a Tmax < 100.5  If you have questions about this conversion, please contact the Pharmacy Department  []   [phone] )  [facility] [x]   [phone] )  [facility] []   [phone] )  [facility] []   [phone] )  [facility] []   [phone] )  [facility]    [place], PharmD

## 2016-03-18 NOTE — Progress Notes (Signed)
Patient alert and oriented, independent in room.  VSS.  No complaints of pain, on RA.    Removed PIV.  To be escorted out of hospital via wheelchair by volunteers.

## 2016-03-19 LAB — CULTURE, RESPIRATORY

## 2016-03-19 LAB — CULTURE, RESPIRATORY W GRAM STAIN

## 2016-03-19 LAB — CULTURE, BLOOD (ROUTINE X 2)
Culture: NO GROWTH
Culture: NO GROWTH

## 2016-03-25 ENCOUNTER — Ambulatory Visit
Admission: RE | Admit: 2016-03-25 | Discharge: 2016-03-25 | Disposition: A | Discharge status: Home / Self Care | Payer: Medicare Other | Source: Ambulatory Visit | Attending: Internal Medicine | Admitting: Internal Medicine

## 2016-03-25 ENCOUNTER — Other Ambulatory Visit: Payer: Self-pay | Admitting: Internal Medicine

## 2016-03-25 DIAGNOSIS — Z09 Encounter for follow-up examination after completed treatment for conditions other than malignant neoplasm: Secondary | ICD-10-CM | POA: Insufficient documentation

## 2016-03-25 DIAGNOSIS — J189 Pneumonia, unspecified organism: Secondary | ICD-10-CM

## 2016-03-25 DIAGNOSIS — Z8701 Personal history of pneumonia (recurrent): Secondary | ICD-10-CM | POA: Insufficient documentation

## 2016-08-07 ENCOUNTER — Other Ambulatory Visit: Payer: Self-pay | Admitting: Neurosurgery

## 2016-08-11 ENCOUNTER — Other Ambulatory Visit: Payer: Self-pay | Admitting: Neurosurgery

## 2016-08-11 DIAGNOSIS — M545 Low back pain, unspecified: Secondary | ICD-10-CM

## 2016-08-11 DIAGNOSIS — G8929 Other chronic pain: Secondary | ICD-10-CM

## 2016-08-11 DIAGNOSIS — M542 Cervicalgia: Secondary | ICD-10-CM

## 2016-09-02 ENCOUNTER — Other Ambulatory Visit: Payer: Medicare Other

## 2016-10-20 ENCOUNTER — Other Ambulatory Visit: Payer: Self-pay | Admitting: Internal Medicine

## 2016-10-20 DIAGNOSIS — Z1231 Encounter for screening mammogram for malignant neoplasm of breast: Secondary | ICD-10-CM

## 2016-11-24 ENCOUNTER — Ambulatory Visit
Admission: RE | Admit: 2016-11-24 | Discharge: 2016-11-24 | Disposition: A | Discharge status: Home / Self Care | Payer: Medicare Other | Source: Ambulatory Visit | Attending: Internal Medicine | Admitting: Internal Medicine

## 2016-11-24 DIAGNOSIS — Z1231 Encounter for screening mammogram for malignant neoplasm of breast: Secondary | ICD-10-CM | POA: Diagnosis not present

## 2017-04-10 IMAGING — MG MM DIGITAL SCREENING BILAT W/ TOMO W/ CAD
8 of 12 series · 8 of 28 positions shown · non-contrast
Comparison: Previous exam(s).

CLINICAL DATA: Screening.

EXAM:
2D DIGITAL SCREENING BILATERAL MAMMOGRAM WITH CAD AND ADJUNCT TOMO

[L CC synth-2D]
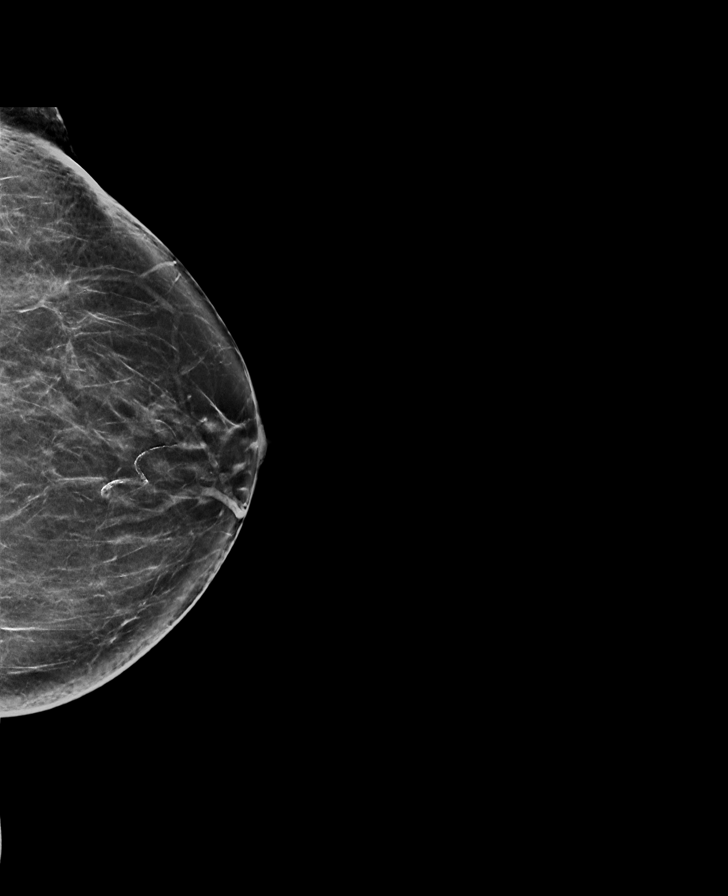

[R CC synth-2D]
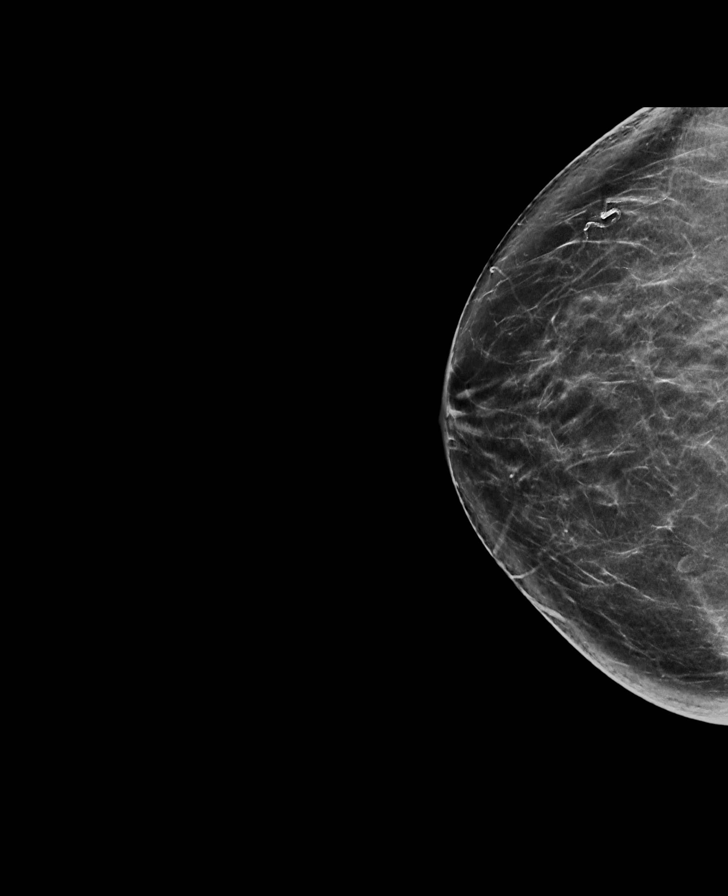

[R MLO]
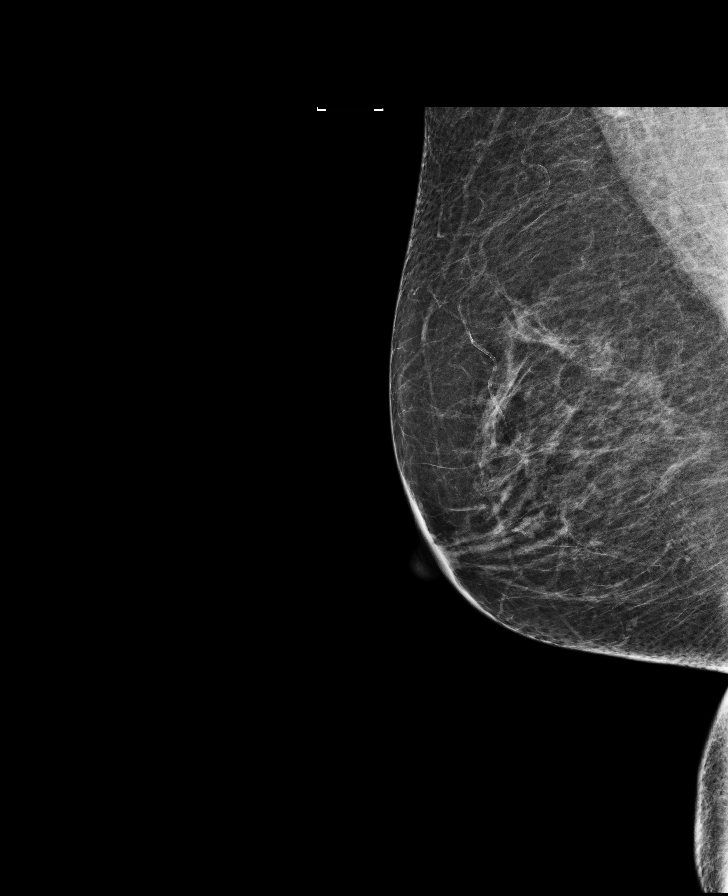

[L MLO synth-2D]
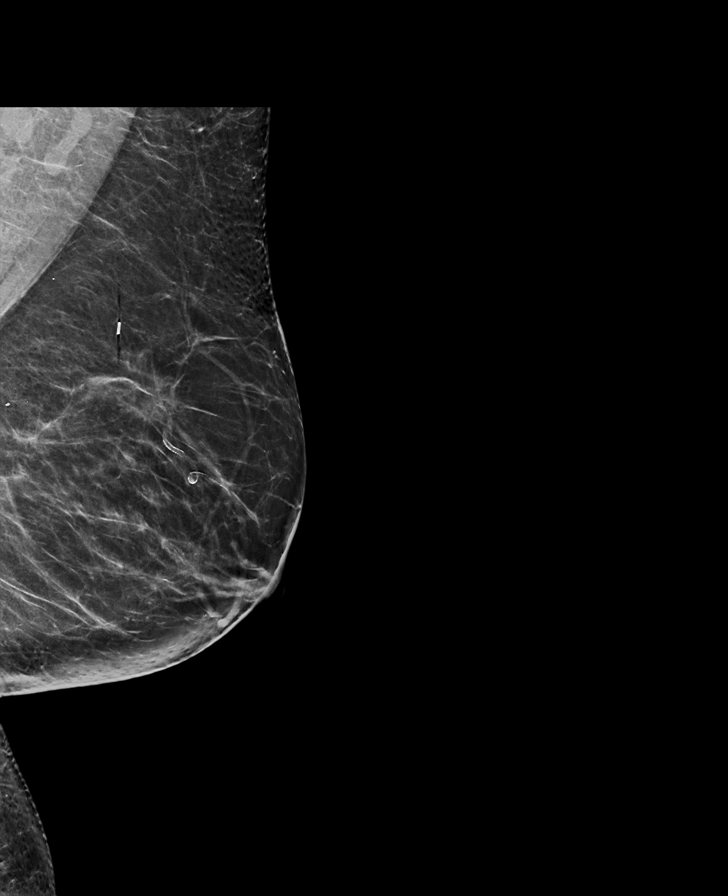

[R CC]
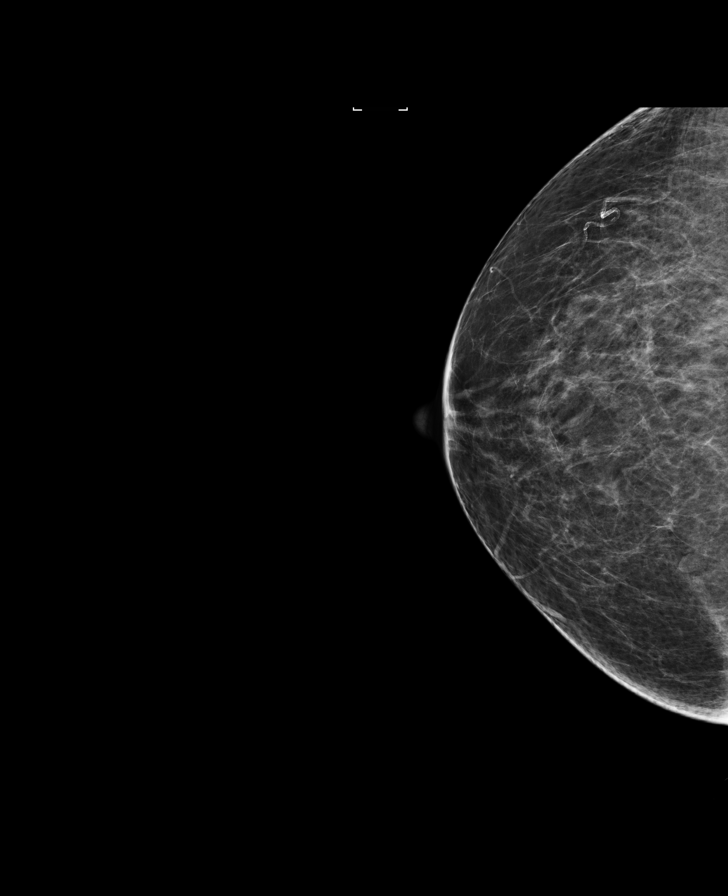

[L CC]
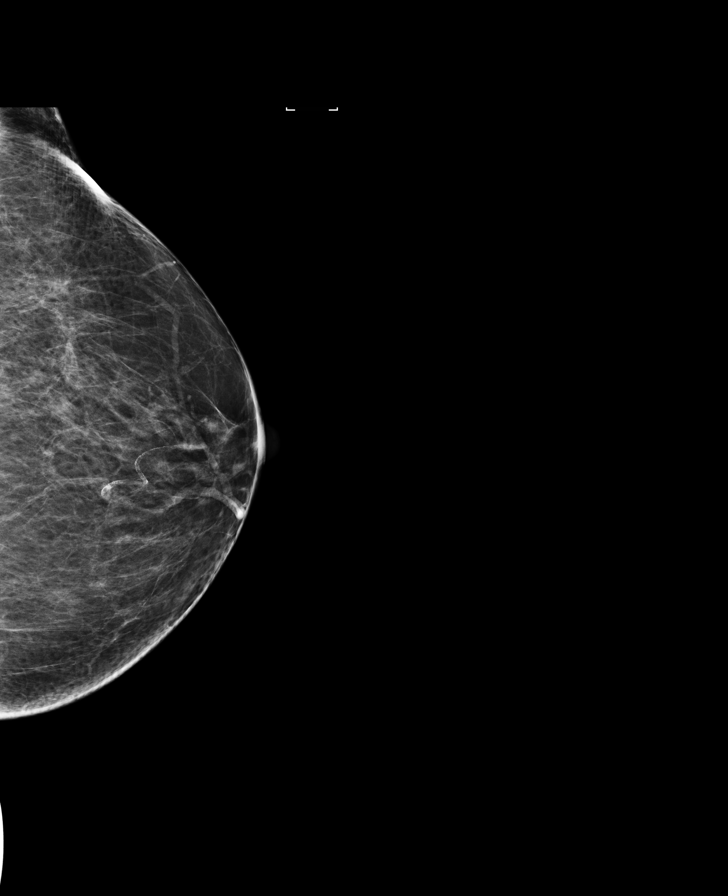

[R MLO synth-2D]
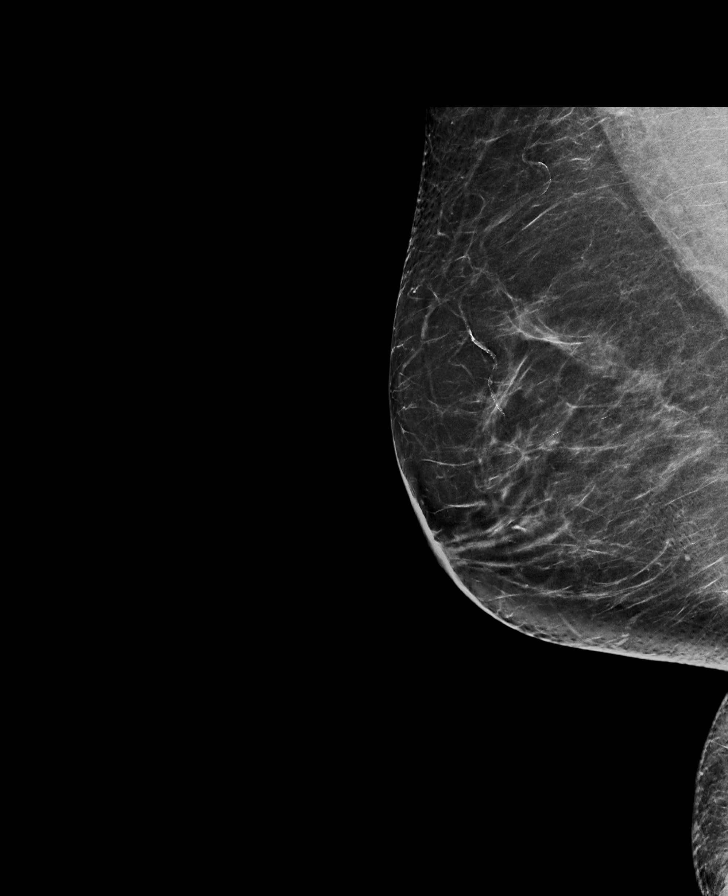

[L MLO]
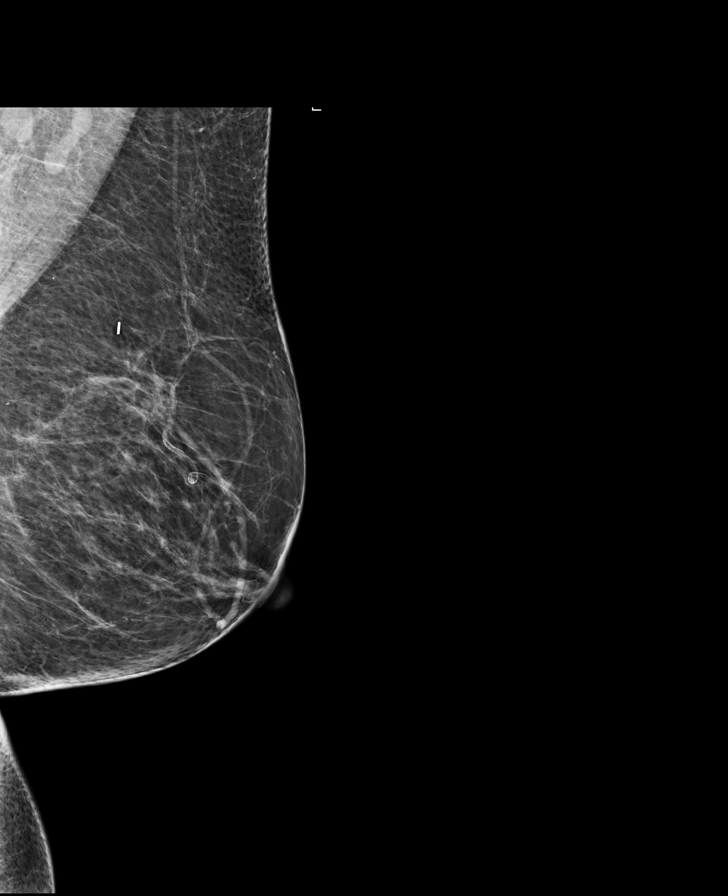

[8 of 28 positions shown; findings below may reference images not displayed]

ACR Breast Density Category b: There are scattered areas of
fibroglandular density.
FINDINGS: There are no findings suspicious for malignancy. Images were
processed with CAD.
IMPRESSION: No mammographic evidence of malignancy. A result letter of this
screening mammogram will be mailed directly to the patient.

RECOMMENDATION:
Screening mammogram in one year. (Code:97-6-RS4)

BI-RADS CATEGORY  1: Negative.

## 2017-07-14 ENCOUNTER — Ambulatory Visit
Admission: RE | Admit: 2017-07-14 | Discharge: 2017-07-14 | Disposition: A | Discharge status: Home / Self Care | Payer: Medicare Other | Source: Ambulatory Visit | Attending: Internal Medicine | Admitting: Internal Medicine

## 2017-07-14 ENCOUNTER — Other Ambulatory Visit: Payer: Self-pay | Admitting: Internal Medicine

## 2017-07-14 DIAGNOSIS — R059 Cough, unspecified: Secondary | ICD-10-CM

## 2017-07-14 DIAGNOSIS — I700 Atherosclerosis of aorta: Secondary | ICD-10-CM | POA: Diagnosis not present

## 2017-07-14 DIAGNOSIS — R05 Cough: Secondary | ICD-10-CM | POA: Diagnosis present

## 2017-07-14 DIAGNOSIS — R062 Wheezing: Secondary | ICD-10-CM | POA: Diagnosis not present

## 2017-11-17 ENCOUNTER — Other Ambulatory Visit: Payer: Self-pay | Admitting: Internal Medicine

## 2017-11-17 DIAGNOSIS — Z1231 Encounter for screening mammogram for malignant neoplasm of breast: Secondary | ICD-10-CM

## 2017-12-15 ENCOUNTER — Ambulatory Visit
Admission: RE | Admit: 2017-12-15 | Discharge: 2017-12-15 | Disposition: A | Discharge status: Home / Self Care | Payer: Medicare Other | Source: Ambulatory Visit | Attending: Internal Medicine | Admitting: Internal Medicine

## 2017-12-15 DIAGNOSIS — Z1231 Encounter for screening mammogram for malignant neoplasm of breast: Secondary | ICD-10-CM | POA: Diagnosis present

## 2018-04-18 ENCOUNTER — Other Ambulatory Visit: Payer: Self-pay | Admitting: Neurosurgery

## 2018-04-18 DIAGNOSIS — M545 Low back pain: Principal | ICD-10-CM

## 2018-04-18 DIAGNOSIS — M542 Cervicalgia: Secondary | ICD-10-CM

## 2018-04-18 DIAGNOSIS — G8929 Other chronic pain: Secondary | ICD-10-CM

## 2018-04-19 ENCOUNTER — Other Ambulatory Visit: Payer: Self-pay | Admitting: Neurosurgery

## 2018-04-19 DIAGNOSIS — M545 Low back pain: Principal | ICD-10-CM

## 2018-04-19 DIAGNOSIS — G8929 Other chronic pain: Secondary | ICD-10-CM

## 2018-04-19 DIAGNOSIS — M542 Cervicalgia: Secondary | ICD-10-CM

## 2018-04-19 NOTE — Progress Notes (Signed)
Phone call to patient to verify medication list and allergies for myelogram procedure. Pt informed to stop Buspar 48hrs prior to myelogram appointment time. Pt verbalized understanding.

## 2018-04-28 ENCOUNTER — Ambulatory Visit
Admission: RE | Admit: 2018-04-28 | Discharge: 2018-04-28 | Disposition: A | Discharge status: Home / Self Care | Payer: Medicare Other | Source: Ambulatory Visit | Attending: Neurosurgery | Admitting: Neurosurgery

## 2018-04-28 ENCOUNTER — Other Ambulatory Visit: Payer: Self-pay | Admitting: Neurosurgery

## 2018-04-28 DIAGNOSIS — M545 Low back pain: Principal | ICD-10-CM

## 2018-04-28 DIAGNOSIS — M542 Cervicalgia: Secondary | ICD-10-CM

## 2018-04-28 DIAGNOSIS — G8929 Other chronic pain: Secondary | ICD-10-CM

## 2018-04-28 MED ORDER — IOPAMIDOL (ISOVUE-M 300) INJECTION 61%
10.0000 mL | Freq: Once | INTRAMUSCULAR | Status: AC | PRN
  Administered 2018-04-28: 10 mL via INTRATHECAL

## 2018-04-28 MED ORDER — DIAZEPAM 5 MG PO TABS
5.0000 mg | ORAL_TABLET | Freq: Once | ORAL | Status: AC
  Administered 2018-04-28: 5 mg via ORAL

## 2018-04-28 NOTE — Discharge Instructions (Signed)
Myelogram Discharge Instructions  1. Go home and rest quietly for the next 24 hours.  It is important to lie flat for the next 24 hours.  Get up only to go to the restroom.  You may lie in the bed or on a couch on your back, your stomach, your left side or your right side.  You may have one pillow under your head.  You may have pillows between your knees while you are on your side or under your knees while you are on your back.  2. DO NOT drive today.  Recline the seat as far back as it will go, while still wearing your seat belt, on the way home.  3. You may get up to go to the bathroom as needed.  You may sit up for 10 minutes to eat.  You may resume your normal diet and medications unless otherwise indicated.  Drink lots of extra fluids today and tomorrow.  4. The incidence of headache, nausea, or vomiting is about 5% (one in 20 patients).  If you develop a headache, lie flat and drink plenty of fluids until the headache goes away.  Caffeinated beverages may be helpful.  If you develop severe nausea and vomiting or a headache that does not go away with flat bed rest, call [phone].  5. You may resume normal activities after your 24 hours of bed rest is over; however, do not exert yourself strongly or do any heavy lifting tomorrow. If when you get up you have a headache when standing, go back to bed and force fluids for another 24 hours.  6. Call your physician for a follow-up appointment.  The results of your myelogram will be sent directly to your physician by the following day.  7. If you have any questions or if complications develop after you arrive home, please call [phone].  Discharge instructions have been explained to the patient.  The patient, or the person responsible for the patient, fully understands these instructions.    MAY RESUME BUSPAR ON Apr 29, 2018, AFTER 9:30 AM.

## 2018-04-28 NOTE — Progress Notes (Signed)
Pt states she has been off Buspar for the past 2 days.

## 2019-01-19 ENCOUNTER — Other Ambulatory Visit: Payer: Self-pay | Admitting: Internal Medicine

## 2019-01-19 DIAGNOSIS — Z1231 Encounter for screening mammogram for malignant neoplasm of breast: Secondary | ICD-10-CM

## 2019-02-01 ENCOUNTER — Ambulatory Visit
Admission: RE | Admit: 2019-02-01 | Discharge: 2019-02-01 | Disposition: A | Discharge status: Home / Self Care | Payer: Medicare Other | Source: Ambulatory Visit | Attending: Internal Medicine | Admitting: Internal Medicine

## 2019-02-01 DIAGNOSIS — Z1231 Encounter for screening mammogram for malignant neoplasm of breast: Secondary | ICD-10-CM | POA: Insufficient documentation

## 2019-02-02 ENCOUNTER — Other Ambulatory Visit: Payer: Self-pay | Admitting: Internal Medicine

## 2019-02-02 DIAGNOSIS — R928 Other abnormal and inconclusive findings on diagnostic imaging of breast: Secondary | ICD-10-CM

## 2019-02-07 ENCOUNTER — Ambulatory Visit
Admission: RE | Admit: 2019-02-07 | Discharge: 2019-02-07 | Disposition: A | Discharge status: Home / Self Care | Payer: Medicare Other | Source: Ambulatory Visit | Attending: Internal Medicine | Admitting: Internal Medicine

## 2019-02-07 DIAGNOSIS — R928 Other abnormal and inconclusive findings on diagnostic imaging of breast: Secondary | ICD-10-CM

## 2019-07-11 ENCOUNTER — Encounter: Payer: Self-pay | Admitting: General Surgery

## 2020-05-27 ENCOUNTER — Ambulatory Visit
Admission: RE | Admit: 2020-05-27 | Discharge: 2020-05-27 | Disposition: A | Discharge status: Home / Self Care | Payer: Medicare PPO | Attending: Family Medicine | Admitting: Family Medicine

## 2020-05-27 ENCOUNTER — Other Ambulatory Visit: Payer: Self-pay | Admitting: Internal Medicine

## 2020-05-27 ENCOUNTER — Ambulatory Visit
Admission: RE | Admit: 2020-05-27 | Discharge: 2020-05-27 | Disposition: A | Discharge status: Home / Self Care | Payer: Medicare PPO | Source: Ambulatory Visit | Attending: Internal Medicine | Admitting: Internal Medicine

## 2020-05-27 ENCOUNTER — Other Ambulatory Visit: Payer: Self-pay

## 2020-05-27 DIAGNOSIS — R059 Cough, unspecified: Secondary | ICD-10-CM

## 2020-05-27 DIAGNOSIS — R05 Cough: Secondary | ICD-10-CM | POA: Insufficient documentation

## 2020-06-20 ENCOUNTER — Other Ambulatory Visit: Payer: Self-pay | Admitting: Internal Medicine

## 2020-06-20 ENCOUNTER — Ambulatory Visit
Admission: RE | Admit: 2020-06-20 | Discharge: 2020-06-20 | Disposition: A | Discharge status: Home / Self Care | Payer: Medicare PPO | Source: Ambulatory Visit | Attending: Internal Medicine | Admitting: Internal Medicine

## 2020-06-20 ENCOUNTER — Other Ambulatory Visit: Payer: Self-pay

## 2020-06-20 ENCOUNTER — Ambulatory Visit
Admission: RE | Admit: 2020-06-20 | Discharge: 2020-06-20 | Disposition: A | Discharge status: Home / Self Care | Payer: Medicare PPO | Attending: Internal Medicine | Admitting: Internal Medicine

## 2020-06-20 DIAGNOSIS — J189 Pneumonia, unspecified organism: Secondary | ICD-10-CM | POA: Insufficient documentation

## 2020-09-02 ENCOUNTER — Telehealth: Payer: Self-pay

## 2020-09-02 NOTE — Telephone Encounter (Signed)
Spoke with patient regarding colonoscopy-Dr.Byrnett contact information provided.

## 2020-12-03 ENCOUNTER — Other Ambulatory Visit: Payer: Self-pay | Admitting: Internal Medicine

## 2020-12-03 DIAGNOSIS — Z1231 Encounter for screening mammogram for malignant neoplasm of breast: Secondary | ICD-10-CM

## 2020-12-16 ENCOUNTER — Ambulatory Visit
Admission: RE | Admit: 2020-12-16 | Discharge: 2020-12-16 | Disposition: A | Discharge status: Home / Self Care | Payer: Medicare PPO | Source: Ambulatory Visit | Attending: Internal Medicine | Admitting: Internal Medicine

## 2020-12-16 ENCOUNTER — Other Ambulatory Visit: Payer: Self-pay

## 2020-12-16 DIAGNOSIS — Z1231 Encounter for screening mammogram for malignant neoplasm of breast: Secondary | ICD-10-CM | POA: Insufficient documentation

## 2022-02-16 ENCOUNTER — Other Ambulatory Visit: Payer: Self-pay | Admitting: General Surgery

## 2022-02-16 NOTE — Progress Notes (Signed)
Subjective:  ?  ? Patient ID: Hannah Greene is a 76 y.o. female. ?  ?HPI ?  ?The following portions of the patient's history were reviewed and updated as appropriate. ?  ?This an established patient is here today for: office visit. Here to discuss having a colonoscopy, last done 10-07-2010. ?  ?She reports occasional loose stools, without associated blood or mucous.  Bowels move every other daily, no bleeding. ?  ?  ?  ?Review of Systems  ?Constitutional: Negative for chills and fever.  ?Respiratory: Negative for cough.   ?Gastrointestinal: Negative for abdominal pain and anal bleeding.  ?  ?  ?   ?Chief Complaint  ?Patient presents with  ? Pre-op Exam  ?  ?  ?BP 134/74   Pulse 70   Temp (!) 35.9 ?C (96.7 ?F)   Ht 172.7 cm ('5\' 8"'$ )   Wt 80.7 kg (178 lb)   SpO2 97%   BMI 27.06 kg/m?  ?  ?    ?Past Medical History:  ?Diagnosis Date  ? Anxiety    ? DDD (degenerative disc disease), lumbar    ? Ganglion cyst of wrist    ? Hypertension    ? Osteoarthritis    ?  HANDS  ? Skin cancer    ?  ?  ?     ?Past Surgical History:  ?Procedure Laterality Date  ? BREAST EXCISIONAL BIOPSY Left 2007  ? COLONOSCOPY   10/07/2010  ?  Dr. Bary Castilla  ? BACK SURGERY   12/07/2010  ? basal cell      ?  removed  ? polypectomy      ?  cervical  ? TONSILLECTOMY      ?  76 YRS OLD  ?  ?  ?  ?        ?OB History   ?  Gravida  ?2  ? Para  ?2  ? Term  ?   ? Preterm  ?   ? AB  ?   ? Living  ?   ?  ?  SAB  ?   ? IAB  ?   ? Ectopic  ?   ? Molar  ?   ? Multiple  ?   ? Live Births  ?   ?  ?  ?  Obstetric Comments  ?Age at first period 10 ?Age of first pregnancy 27 ?   ?  ?   ?  ?  ?Social History  ?  ?  ?     ?Socioeconomic History  ? Marital status: Married  ?Tobacco Use  ? Smoking status: Former  ?    Packs/day: 1.00  ?    Years: 4.00  ?    Pack years: 4.00  ?    Types: Cigarettes  ?    Quit date: 05/05/1977  ?    Years since quitting: 44.8  ? Smokeless tobacco: Never  ?Substance and Sexual Activity  ? Alcohol use: No  ? Drug use: No  ?  ?  ?  ?     ?Allergies  ?Allergen Reactions  ? Sulfa (Sulfonamide Antibiotics) Hives  ?  ?  ?Current Medications  ?      ?Current Outpatient Medications  ?Medication Sig Dispense Refill  ? acetaminophen (TYLENOL) 500 MG tablet Take by mouth.      ? ALPRAZolam (XANAX) 0.5 MG tablet Take 0.5 mg by mouth 2 (two) times daily as needed      ? ASCORBATE CALCIUM (VITAMIN  C ORAL) Take by mouth as directed.      ? busPIRone (BUSPAR) 15 MG tablet Take 15 mg by mouth 2 (two) times daily.      ? CALCIUM ORAL Take by mouth as directed.      ? DOCOSAHEXANOIC ACID/EPA (FISH OIL ORAL) Take by mouth as directed.      ? gabapentin (NEURONTIN) 300 MG capsule Take 300 mg by mouth 2 (two) times daily.      ? glucosamine-chondroitin 500-400 mg capsule Take by mouth.      ? losartan (COZAAR) 50 MG tablet Take 50 mg by mouth once daily      ? methocarbamol (ROBAXIN) 500 MG tablet Take 500 mg by mouth once daily as needed.      ? NAPROXEN SODIUM (ALEVE ORAL) Take by mouth 2 (two) times daily.      ? bisacodyL (DULCOLAX) 5 mg EC tablet Take two tablets morning and two tablets afternoon day prior to Miralax prep. 4 tablet 0  ? polyethylene glycol (MIRALAX) powder One bottle for colonoscopy prep. Use as directed. 255 g 0  ? RED YEAST RICE ORAL Take by mouth as directed. (Patient not taking: Reported on 02/12/2022)      ?  ?No current facility-administered medications for this visit.  ?  ?  ?  ?     ?Family History  ?Problem Relation Age of Onset  ? COPD Mother    ? Osteoarthritis Mother    ?      MILD  ? Stroke Mother    ? Heart disease Father    ? Osteoarthritis Father    ?      KNEES  ? Breast cancer Neg Hx    ? Colon cancer Neg Hx    ?  ?  ?  ?Labs and Radiology:  ?  ?2011 colonoscopy images reviewed.  ?  ?   ?Objective:  ? Physical Exam ?Constitutional:   ?   Appearance: Normal appearance.  ?Cardiovascular:  ?   Rate and Rhythm: Normal rate and regular rhythm.  ?   Pulses: Normal pulses.  ?   Heart sounds: Normal heart sounds.  ?Pulmonary:  ?   Effort:  Pulmonary effort is normal.  ?   Breath sounds: Normal breath sounds.  ?Musculoskeletal:  ?   Cervical back: Neck supple.  ?Skin: ?   General: Skin is warm and dry.  ?Neurological:  ?   Mental Status: She is alert and oriented to person, place, and time.  ?Psychiatric:     ?   Mood and Affect: Mood normal.     ?   Behavior: Behavior normal.  ?  ?  ?  ?   ?Assessment:  ?   ?Candidate for follow-up colonoscopy. ?   ?Plan:  ?   ?Pros and cons of colonoscopy versus Cologuard testing were reviewed.  At this time patient is amenable to proceed with colonoscopy.  Risk discussed.  Preparation methods reviewed by the staff. ?   ?  ?This note is partially prepared by Karie Fetch, RN, acting as a scribe in the presence of Dr. Hervey Ard, MD.  ?The documentation recorded by the scribe accurately reflects the service I personally performed and the decisions made by me.  ?  ?Robert Bellow, MD FACS ?

## 2022-03-10 ENCOUNTER — Encounter: Payer: Self-pay | Admitting: General Surgery

## 2022-03-11 ENCOUNTER — Ambulatory Visit: Payer: Medicare PPO | Admitting: Anesthesiology

## 2022-03-11 ENCOUNTER — Encounter
Admission: RE | Disposition: A | Discharge status: Home / Self Care | Payer: Self-pay | Source: Home / Self Care | Attending: General Surgery

## 2022-03-11 ENCOUNTER — Ambulatory Visit
Admission: RE | Admit: 2022-03-11 | Discharge: 2022-03-11 | Disposition: A | Discharge status: Home / Self Care | Payer: Medicare PPO | Attending: General Surgery | Admitting: General Surgery

## 2022-03-11 ENCOUNTER — Encounter: Payer: Self-pay | Admitting: General Surgery

## 2022-03-11 DIAGNOSIS — Z85828 Personal history of other malignant neoplasm of skin: Secondary | ICD-10-CM | POA: Diagnosis not present

## 2022-03-11 DIAGNOSIS — Z1211 Encounter for screening for malignant neoplasm of colon: Secondary | ICD-10-CM | POA: Insufficient documentation

## 2022-03-11 DIAGNOSIS — Z79899 Other long term (current) drug therapy: Secondary | ICD-10-CM | POA: Insufficient documentation

## 2022-03-11 DIAGNOSIS — F419 Anxiety disorder, unspecified: Secondary | ICD-10-CM | POA: Insufficient documentation

## 2022-03-11 DIAGNOSIS — I10 Essential (primary) hypertension: Secondary | ICD-10-CM | POA: Insufficient documentation

## 2022-03-11 DIAGNOSIS — Z87891 Personal history of nicotine dependence: Secondary | ICD-10-CM | POA: Diagnosis not present

## 2022-03-11 DIAGNOSIS — K635 Polyp of colon: Secondary | ICD-10-CM | POA: Insufficient documentation

## 2022-03-11 HISTORY — PX: COLONOSCOPY WITH PROPOFOL: SHX5780

## 2022-03-11 SURGERY — COLONOSCOPY WITH PROPOFOL
Anesthesia: General

## 2022-03-11 MED ORDER — PROPOFOL 10 MG/ML IV BOLUS
INTRAVENOUS | Status: DC | PRN
  Administered 2022-03-11: 20 mg via INTRAVENOUS
  Administered 2022-03-11: 70 mg via INTRAVENOUS

## 2022-03-11 MED ORDER — PROPOFOL 500 MG/50ML IV EMUL
INTRAVENOUS | Status: DC | PRN
  Administered 2022-03-11: 165 ug/kg/min via INTRAVENOUS

## 2022-03-11 MED ORDER — PROPOFOL 10 MG/ML IV BOLUS
INTRAVENOUS | Status: AC
  Filled 2022-03-11: qty 40

## 2022-03-11 MED ORDER — SODIUM CHLORIDE 0.9 % IV SOLN: INTRAVENOUS | Status: DC

## 2022-03-11 NOTE — Anesthesia Postprocedure Evaluation (Signed)
Anesthesia Post Note ? ?Patient: Hannah Greene ? ?Procedure(s) Performed: COLONOSCOPY WITH PROPOFOL ? ?Patient location during evaluation: Endoscopy ?Anesthesia Type: General ?Level of consciousness: awake and alert ?Pain management: pain level controlled ?Vital Signs Assessment: post-procedure vital signs reviewed and stable ?Respiratory status: spontaneous breathing, nonlabored ventilation, respiratory function stable and patient connected to nasal cannula oxygen ?Cardiovascular status: blood pressure returned to baseline and stable ?Postop Assessment: no apparent nausea or vomiting ?Anesthetic complications: no ? ? ?No notable events documented. ? ? ?Last Vitals:  ?Vitals:  ? 03/11/22 1042 03/11/22 1047  ?BP:  116/65  ?Pulse:    ?Resp:    ?Temp:    ?SpO2: 99%   ?  ?Last Pain:  ?Vitals:  ? 03/11/22 1049  ?TempSrc:   ?PainSc: 0-No pain  ? ? ?  ?  ?  ?  ?  ?  ? ?Arita Miss ? ? ? ? ?

## 2022-03-11 NOTE — Op Note (Signed)
[facility] ?Gastroenterology ?Patient Name: Hannah Greene ?Procedure Date: 03/11/2022 9:45 AM ?MRN: [id number] ?Account #: [id number] ?Date of Birth: [birth year] ?Admit Type: Outpatient ?Age: 76 ?Room: [facility] ENDO ROOM 1 ?Gender: Female ?Note Status: Finalized ?Instrument Name: Peds Colonoscope [id number] ?Procedure:             Colonoscopy ?Indications:           Screening for colorectal malignant neoplasm ?Providers:             Robert Bellow, MD ?Referring MD:          Leona Carry. Hall Busing, MD (Referring MD) ?Medicines:             Propofol per Anesthesia ?Complications:         No immediate complications. ?Procedure:             Pre-Anesthesia Assessment: ?                       - Prior to the procedure, a History and Physical was  ?                       performed, and patient medications, allergies and  ?                       sensitivities were reviewed. The patient's tolerance  ?                       of previous anesthesia was reviewed. ?                       - The risks and benefits of the procedure and the  ?                       sedation options and risks were discussed with the  ?                       patient. All questions were answered and informed  ?                       consent was obtained. ?                       After obtaining informed consent, the colonoscope was  ?                       passed under direct vision. Throughout the procedure,  ?                       the patient's blood pressure, pulse, and oxygen  ?                       saturations were monitored continuously. The  ?                       Colonoscope was introduced through the anus and  ?                       advanced to the the cecum, identified by appendiceal  ?  orifice and ileocecal valve. The colonoscopy was  ?                       somewhat difficult due to significant looping.  ?                       Successful completion of the procedure was aided by  ?                       using  manual pressure. The patient tolerated the  ?                       procedure well. The quality of the bowel preparation  ?                       was excellent. ?Findings: ?     Two sessile polyps were found in the rectum. The polyps were 5 mm in  ?     size. These were biopsied with a cold forceps for histology. ?     The retroflexed view of the distal rectum and anal verge was normal and  ?     showed no anal or rectal abnormalities. ?Impression:            - Two 5 mm polyps in the rectum. Biopsied. ?                       - The distal rectum and anal verge are normal on  ?                       retroflexion view. ?Recommendation:        - Telephone endoscopist for pathology results in 1  ?                       week. ?Procedure Code(s):     --- Professional --- ?                       [phone], Colonoscopy, flexible; with biopsy, single or  ?                       multiple ?Diagnosis Code(s):     --- Professional --- ?                       Z12.11, Encounter for screening for malignant neoplasm  ?                       of colon ?                       K62.1, Rectal polyp ?CPT copyright 2019 [employer]. All rights reserved. ?The codes documented in this report are preliminary and upon coder review may  ?be revised to meet current compliance requirements. ?Robert Bellow, MD ?03/11/2022 10:31:47 AM ?This report has been signed electronically. ?Number of Addenda: 0 ?Note Initiated On: 03/11/2022 9:45 AM ?Scope Withdrawal Time: 0 hours 14 minutes 38 seconds  ?Total Procedure Duration: 0 hours 27 minutes 50 seconds  ?Estimated Blood Loss:  Estimated blood loss was minimal. ?     [facility] ?

## 2022-03-11 NOTE — Transfer of Care (Signed)
Immediate Anesthesia Transfer of Care Note ? ?Patient: Hannah Greene ? ?Procedure(s) Performed: COLONOSCOPY WITH PROPOFOL ? ?Patient Location: Endoscopy Unit ? ?Anesthesia Type:General ? ?Level of Consciousness: awake ? ?Airway & Oxygen Therapy: Patient Spontanous Breathing ? ?Post-op Assessment: Report given to RN and Post -op Vital signs reviewed and stable ? ?Post vital signs: Reviewed and stable ? ?Last Vitals:  ?Vitals Value Taken Time  ?BP 105/54 03/11/22 1035  ?Temp    ?Pulse 84 03/11/22 1035  ?Resp 15 03/11/22 1035  ?SpO2 100 % 03/11/22 1035  ? ? ?Last Pain:  ?Vitals:  ? 03/11/22 0934  ?TempSrc: Temporal  ?   ? ?  ? ?Complications: No notable events documented. ?

## 2022-03-11 NOTE — Anesthesia Preprocedure Evaluation (Signed)
Anesthesia Evaluation  ?Patient identified by MRN, date of birth, ID band ?Patient awake ? ? ? ?Reviewed: ?Allergy & Precautions, NPO status , Patient's Chart, lab work & pertinent test results ? ?History of Anesthesia Complications ?Negative for: history of anesthetic complications ? ?Airway ?Mallampati: II ? ?TM Distance: >3 FB ?Neck ROM: Full ? ? ? Dental ?no notable dental hx. ?(+) Teeth Intact ?  ?Pulmonary ?neg pulmonary ROS, neg sleep apnea, neg COPD, Patient abstained from smoking.Not current smoker, former smoker,  ?  ?Pulmonary exam normal ?breath sounds clear to auscultation ? ? ? ? ? ? Cardiovascular ?Exercise Tolerance: Good ?METShypertension, Pt. on medications ?(-) CAD and (-) Past MI (-) dysrhythmias  ?Rhythm:Regular Rate:Normal ?- Systolic murmurs ? ?  ?Neuro/Psych ?PSYCHIATRIC DISORDERS Anxiety negative neurological ROS ?   ? GI/Hepatic ?neg GERD  ,(+)  ?  ? (-) substance abuse ? ,   ?Endo/Other  ?neg diabetes ? Renal/GU ?negative Renal ROS  ? ?  ?Musculoskeletal ? ?(+) Arthritis ,  ? Abdominal ?  ?Peds ? Hematology ?  ?Anesthesia Other Findings ?Past Medical History: ?No date: Anxiety ?No date: Cancer [facility]) ?    Comment:  skin ?No date: DDD (degenerative disc disease), lumbar ?No date: Hypertension ?No date: Osteoarthritis ? Reproductive/Obstetrics ? ?  ? ? ? ? ? ? ? ? ? ? ? ? ? ?  ?  ? ? ? ? ? ? ? ? ?Anesthesia Physical ?Anesthesia Plan ? ?ASA: 2 ? ?Anesthesia Plan: General  ? ?Post-op Pain Management: Minimal or no pain anticipated  ? ?Induction: Intravenous ? ?PONV Risk Score and Plan: 3 and Propofol infusion, TIVA and Ondansetron ? ?Airway Management Planned: Nasal Cannula ? ?Additional Equipment: None ? ?Intra-op Plan:  ? ?Post-operative Plan:  ? ?Informed Consent: I have reviewed the patients History and Physical, chart, labs and discussed the procedure including the risks, benefits and alternatives for the proposed anesthesia with the patient or authorized  representative who has indicated his/her understanding and acceptance.  ? ? ? ?Dental advisory given ? ?Plan Discussed with: CRNA and Surgeon ? ?Anesthesia Plan Comments: (Discussed risks of anesthesia with patient, including possibility of difficulty with spontaneous ventilation under anesthesia necessitating airway intervention, PONV, and rare risks such as cardiac or respiratory or neurological events, and allergic reactions. Discussed the role of CRNA in patient's perioperative care. Patient understands.)  ? ? ? ? ? ? ?Anesthesia Quick Evaluation ? ?

## 2022-03-11 NOTE — H&P (Signed)
Hannah Greene ?[id number] ?1946-12-03 ? ?  ? ?HPI: Healthy 76 y/o woman for a screening colonoscopy. Tolerated the prep well.  ? ?Medications Prior to Admission  ?Medication Sig Dispense Refill Last Dose  ? acetaminophen (TYLENOL) 500 MG tablet Take 500 mg by mouth at bedtime.   03/10/2022  ? ALPRAZolam (XANAX) 0.5 MG tablet Take 0.5 mg by mouth 3 (three) times daily as needed for anxiety.   Past Month  ? busPIRone (BUSPAR) 15 MG tablet Take 15 mg by mouth 2 (two) times daily.   03/10/2022  ? Calcium Carbonate-Vit D-Min (CALTRATE 600+D PLUS MINERALS) 600-800 MG-UNIT CHEW Chew by mouth.   Past Week  ? gabapentin (NEURONTIN) 300 MG capsule Take 300 mg by mouth 2 (two) times daily.   03/10/2022  ? losartan (COZAAR) 50 MG tablet Take 1 tablet (50 mg total) by mouth daily. 30 tablet 0 03/10/2022  ? methocarbamol (ROBAXIN) 500 MG tablet Take 500 mg by mouth daily as needed for muscle spasms.     ? naproxen sodium (ANAPROX) 220 MG tablet Take 440 mg by mouth every morning.   Past Week  ? Omega-3 Fatty Acids (FISH OIL) 1000 MG CAPS Take by mouth.   Past Week  ? vitamin C (ASCORBIC ACID) 500 MG tablet Take 500 mg by mouth 2 (two) times daily.   03/10/2022  ? Glucosamine-Chondroitin (COSAMIN DS) 500-400 MG CAPS Take 1 capsule by mouth 2 (two) times daily.     ? HYDROcodone-acetaminophen (NORCO) 10-325 MG tablet Take 1 tablet by mouth every 6 (six) hours as needed.     ? Red Yeast Rice 600 MG CAPS Take 1 capsule by mouth 2 (two) times daily.     ? ?Allergies  ?Allergen Reactions  ? Sulfa Antibiotics Hives  ? ?Past Medical History:  ?Diagnosis Date  ? Anxiety   ? Cancer [facility])   ? skin  ? DDD (degenerative disc disease), lumbar   ? Hypertension   ? Osteoarthritis   ? ?Past Surgical History:  ?Procedure Laterality Date  ? BREAST BIOPSY Left 2007  ? skin mole excision     ? TONSILLECTOMY    ? uterine polyps     ? ?Social History  ? ?Socioeconomic History  ? Marital status: Married  ?  Spouse name: Not on file  ? Number of children: Not on  file  ? Years of education: Not on file  ? Highest education level: Not on file  ?Occupational History  ? Not on file  ?Tobacco Use  ? Smoking status: Former  ?  Packs/day: 1.00  ?  Years: 4.00  ?  Pack years: 4.00  ?  Types: Cigarettes  ? Smokeless tobacco: Never  ?Vaping Use  ? Vaping Use: Never used  ?Substance and Sexual Activity  ? Alcohol use: No  ? Drug use: No  ? Sexual activity: Not on file  ?Other Topics Concern  ? Not on file  ?Social History Narrative  ? Not on file  ? ?Social Determinants of Health  ? ?Financial Resource Strain: Not on file  ?Food Insecurity: Not on file  ?Transportation Needs: Not on file  ?Physical Activity: Not on file  ?Stress: Not on file  ?Social Connections: Not on file  ?Intimate Partner Violence: Not on file  ? ?Social History  ? ?Social History Narrative  ? Not on file  ? ? ? ?ROS: Negative.  ? ? ? ?PE: ?HEENT: Negative. ?Lungs: Clear. ?Cardio: RR. ? ?Assessment/Plan: ? ?Proceed with planned endoscopy.  ? ?  Forest Gleason Elliott Quade ?03/11/2022 ? ?  ?

## 2022-03-12 LAB — SURGICAL PATHOLOGY

## 2023-01-22 ENCOUNTER — Encounter: Payer: Self-pay | Admitting: Internal Medicine

## 2023-01-22 DIAGNOSIS — Z1231 Encounter for screening mammogram for malignant neoplasm of breast: Secondary | ICD-10-CM

## 2023-01-25 ENCOUNTER — Other Ambulatory Visit: Payer: Self-pay | Admitting: Internal Medicine

## 2023-01-25 DIAGNOSIS — Z1231 Encounter for screening mammogram for malignant neoplasm of breast: Secondary | ICD-10-CM

## 2023-01-25 DIAGNOSIS — E2839 Other primary ovarian failure: Secondary | ICD-10-CM

## 2023-03-11 ENCOUNTER — Other Ambulatory Visit: Payer: Medicare PPO

## 2023-04-28 ENCOUNTER — Ambulatory Visit
Admission: RE | Admit: 2023-04-28 | Discharge: 2023-04-28 | Disposition: A | Discharge status: Home / Self Care | Payer: Medicare PPO | Source: Ambulatory Visit | Attending: Internal Medicine | Admitting: Internal Medicine

## 2023-04-28 DIAGNOSIS — Z1231 Encounter for screening mammogram for malignant neoplasm of breast: Secondary | ICD-10-CM

## 2023-04-28 DIAGNOSIS — E2839 Other primary ovarian failure: Secondary | ICD-10-CM | POA: Diagnosis present

## 2024-03-09 LAB — CBC AND DIFFERENTIAL
HCT: 45 (ref 36–46)
Hemoglobin: 15.2 (ref 12.0–16.0)
Platelets: 205 K/uL (ref 150–400)
WBC: 5.1

## 2024-03-09 LAB — HEPATIC FUNCTION PANEL
ALT: 10 U/L (ref 7–35)
AST: 20 (ref 13–35)

## 2024-03-09 LAB — BASIC METABOLIC PANEL WITH GFR
Creatinine: 1 (ref 0.5–1.1)
Glucose: 80
Potassium: 4.7 meq/L (ref 3.5–5.1)
Sodium: 142 (ref 137–147)

## 2024-03-09 LAB — COMPREHENSIVE METABOLIC PANEL WITH GFR: eGFR: 60

## 2024-03-09 LAB — LIPID PANEL
Cholesterol: 244 — AB (ref 0–200)
HDL: 90 — AB (ref 35–70)
LDL Cholesterol: 135
Triglycerides: 110 (ref 40–160)

## 2024-03-09 LAB — CBC: RBC: 4.89 (ref 3.87–5.11)

## 2024-03-09 LAB — TSH: TSH: 1.36 (ref 0.41–5.90)

## 2024-03-09 LAB — HM HEPATITIS C SCREENING LAB: HM Hepatitis Screen: NEGATIVE

## 2024-07-25 ENCOUNTER — Ambulatory Visit: Admitting: Internal Medicine

## 2024-07-25 ENCOUNTER — Other Ambulatory Visit: Payer: Self-pay

## 2024-07-25 ENCOUNTER — Encounter: Payer: Self-pay | Admitting: Internal Medicine

## 2024-07-25 VITALS — BP 128/82 | HR 97 | Temp 98.1°F | Resp 16 | Ht 69.0 in | Wt 176.5 lb

## 2024-07-25 DIAGNOSIS — M159 Polyosteoarthritis, unspecified: Secondary | ICD-10-CM

## 2024-07-25 DIAGNOSIS — E782 Mixed hyperlipidemia: Secondary | ICD-10-CM | POA: Diagnosis not present

## 2024-07-25 DIAGNOSIS — L304 Erythema intertrigo: Secondary | ICD-10-CM

## 2024-07-25 DIAGNOSIS — F419 Anxiety disorder, unspecified: Secondary | ICD-10-CM

## 2024-07-25 DIAGNOSIS — Z1231 Encounter for screening mammogram for malignant neoplasm of breast: Secondary | ICD-10-CM

## 2024-07-25 DIAGNOSIS — Z23 Encounter for immunization: Secondary | ICD-10-CM

## 2024-07-25 DIAGNOSIS — I10 Essential (primary) hypertension: Secondary | ICD-10-CM | POA: Diagnosis not present

## 2024-07-25 DIAGNOSIS — K580 Irritable bowel syndrome with diarrhea: Secondary | ICD-10-CM

## 2024-07-25 DIAGNOSIS — Z85828 Personal history of other malignant neoplasm of skin: Secondary | ICD-10-CM

## 2024-07-25 DIAGNOSIS — G629 Polyneuropathy, unspecified: Secondary | ICD-10-CM

## 2024-07-25 MED ORDER — GABAPENTIN 300 MG PO CAPS: 300.0000 mg | ORAL_CAPSULE | Freq: Two times a day (BID) | ORAL | 1 refills | Status: AC

## 2024-07-25 MED ORDER — SERTRALINE HCL 50 MG PO TABS: 50.0000 mg | ORAL_TABLET | Freq: Every day | ORAL | 1 refills | Status: AC

## 2024-07-25 MED ORDER — BUSPIRONE HCL 15 MG PO TABS: 15.0000 mg | ORAL_TABLET | Freq: Two times a day (BID) | ORAL | 1 refills | Status: AC

## 2024-07-25 MED ORDER — KETOCONAZOLE 2 % EX CREA: 1.0000 | TOPICAL_CREAM | Freq: Every day | CUTANEOUS | 1 refills | Status: AC

## 2024-07-25 MED ORDER — LOSARTAN POTASSIUM 50 MG PO TABS: 50.0000 mg | ORAL_TABLET | Freq: Every day | ORAL | 1 refills | Status: AC

## 2024-07-25 NOTE — Progress Notes (Signed)
 New Patient Office Visit  Subjective    Patient ID: Hannah Greene, female    DOB: 08/11/1946  Age: 78 y.o. MRN: [id number]  CC:  Chief Complaint  Patient presents with   Establish Care    HPI Hannah Greene presents to establish care.  Discussed the use of AI scribe software for clinical note transcription with the patient, who gave verbal consent to proceed.  History of Present Illness Hannah Greene is a 78 year old female who presents for an establishment of care visit.  Hypertension is managed with losartan  50 mg daily, maintaining a blood pressure of 128/82 mmHg. Anxiety is controlled with Buspar  15 mg twice daily and sertraline  50 mg once daily, with occasional symptoms in stressful situations. She takes a probiotic, Align, for irritable bowel syndrome, experiencing diarrhea and constipation often triggered by anxiety.  Arthritis and neuropathy in her feet, primarily due to back issues, are managed with naproxen, usually one tablet in the morning, and Tylenol  as needed. She experiences occasional diarrhea and incontinence, using a pad and steroid cream for associated redness and irritation.  She has a history of basal and squamous cell skin cancers, with regular dermatology follow-ups every four to six months. Her current medications include losartan , Buspar , sertraline , gabapentin , naproxen, omega-3 fatty acids, Tylenol , a probiotic, and occasionally Robaxin.   Hypertension: -Medications: Losartan  50 mg -Patient is compliant with above medications and reports no side effects. Has been on medication and stable for years.  -Denies any SOB, CP, vision changes, LE edema or symptoms of hypotension  Anxiety: -Currently on Zoloft  50 mg and Buspar  15 mg  -Symptoms well controlled -Patient compliant with medications and denies side effects  Lumbar Spinal Stenosis/Neuropathy/OA: -Following with Neurosurgery Dr. Mavis in [place] -Planning to schedule MRI -Currently on  Gabapentin  300 mg BID and Naproxen 220 mg once in the morning  Health Maintenance: -Blood work UTD -Colon cancer screening: colonoscopy 4/23, repeat in 10 years -Mammogram 5/24 Birads-1  -Prevnar 20 due  Outpatient Encounter Medications as of 07/25/2024  Medication Sig   acetaminophen  (TYLENOL ) 500 MG tablet Take 500 mg by mouth at bedtime.   busPIRone  (BUSPAR ) 15 MG tablet Take 15 mg by mouth 2 (two) times daily.   Calcium Carbonate-Vit D-Min (CALTRATE 600+D PLUS MINERALS) 600-800 MG-UNIT CHEW Chew by mouth.   gabapentin  (NEURONTIN ) 300 MG capsule Take 300 mg by mouth 2 (two) times daily.   losartan  (COZAAR ) 50 MG tablet Take 1 tablet (50 mg total) by mouth daily.   methocarbamol (ROBAXIN) 500 MG tablet Take 500 mg by mouth daily as needed for muscle spasms.   Omega-3 Fatty Acids (FISH OIL) 1000 MG CAPS Take by mouth.   ALPRAZolam (XANAX) 0.5 MG tablet Take 0.5 mg by mouth 3 (three) times daily as needed for anxiety.   Glucosamine-Chondroitin (COSAMIN DS) 500-400 MG CAPS Take 1 capsule by mouth 2 (two) times daily.   HYDROcodone-acetaminophen  (NORCO) 10-325 MG tablet Take 1 tablet by mouth every 6 (six) hours as needed.   naproxen sodium (ANAPROX) 220 MG tablet Take 440 mg by mouth every morning.   Red Yeast Rice 600 MG CAPS Take 1 capsule by mouth 2 (two) times daily.   vitamin C (ASCORBIC ACID) 500 MG tablet Take 500 mg by mouth 2 (two) times daily.   No facility-administered encounter medications on file as of 07/25/2024.    Past Medical History:  Diagnosis Date   Anxiety    Cancer (HCC)    skin  DDD (degenerative disc disease), lumbar    Hypertension    Osteoarthritis     Past Surgical History:  Procedure Laterality Date   BREAST BIOPSY Left 2007   COLONOSCOPY WITH PROPOFOL  N/A 03/11/2022   Procedure: COLONOSCOPY WITH PROPOFOL ;  Surgeon: Dessa Reyes ORN, MD;  Location: [facility] ENDOSCOPY;  Service: Endoscopy;  Laterality: N/A;  Patient does not want to be 1st case.   skin  mole excision      TONSILLECTOMY     uterine polyps       Family History  Problem Relation Age of Onset   Heart disease Father    Osteoarthritis Father    COPD Mother    Osteoarthritis Mother    Stroke Mother    Breast cancer Neg Hx     Social History   Socioeconomic History   Marital status: Married    Spouse name: Not on file   Number of children: Not on file   Years of education: Not on file   Highest education level: Master's degree (e.g., MA, MS, MEng, MEd, MSW, MBA)  Occupational History   Not on file  Tobacco Use   Smoking status: Former    Current packs/day: 1.00    Average packs/day: 1 pack/day for 4.0 years (4.0 ttl pk-yrs)    Types: Cigarettes   Smokeless tobacco: Never  Vaping Use   Vaping status: Never Used  Substance and Sexual Activity   Alcohol use: No   Drug use: No   Sexual activity: Not Currently  Other Topics Concern   Not on file  Social History Narrative   Not on file   Social Drivers of Health   Financial Resource Strain: Low Risk  (07/24/2024)   Overall Financial Resource Strain (CARDIA)    Difficulty of Paying Living Expenses: Not hard at all  Food Insecurity: No Food Insecurity (07/24/2024)   Hunger Vital Sign    Worried About Running Out of Food in the Last Year: Never true    Ran Out of Food in the Last Year: Never true  Transportation Needs: No Transportation Needs (07/24/2024)   PRAPARE - Administrator, Civil Service (Medical): No    Lack of Transportation (Non-Medical): No  Physical Activity: Inactive (07/24/2024)   Exercise Vital Sign    Days of Exercise per Week: 0 days    Minutes of Exercise per Session: Not on file  Stress: Stress Concern Present (07/24/2024)   [employer] of Occupational Health - Occupational Stress Questionnaire    Feeling of Stress: To some extent  Social Connections: Socially Integrated (07/24/2024)   Social Connection and Isolation Panel    Frequency of Communication with Friends and  Family: More than three times a week    Frequency of Social Gatherings with Friends and Family: More than three times a week    Attends Religious Services: More than 4 times per year    Active Member of [employer] or Organizations: Yes    Attends Engineer, structural: More than 4 times per year    Marital Status: Married  Catering manager Violence: Not on file    Review of Systems  All other systems reviewed and are negative.       Objective    BP 128/82 (Cuff Size: Large)   Pulse 97   Temp 98.1 F (36.7 C) (Oral)   Resp 16   Ht 5' 9 (1.753 m)   Wt 176 lb 8 oz (80.1 kg)   SpO2 99%  BMI 26.06 kg/m   Physical Exam Constitutional:      Appearance: Normal appearance.  HENT:     Head: Normocephalic and atraumatic.     Mouth/Throat:     Mouth: Mucous membranes are moist.     Pharynx: Oropharynx is clear.  Eyes:     Extraocular Movements: Extraocular movements intact.     Conjunctiva/sclera: Conjunctivae normal.     Pupils: Pupils are equal, round, and reactive to light.  Neck:     Comments: No thyromegaly Cardiovascular:     Rate and Rhythm: Normal rate and regular rhythm.  Pulmonary:     Effort: Pulmonary effort is normal.     Breath sounds: Normal breath sounds.  Musculoskeletal:     Cervical back: No tenderness.     Right lower leg: No edema.     Left lower leg: No edema.  Lymphadenopathy:     Cervical: No cervical adenopathy.  Skin:    General: Skin is warm and dry.  Neurological:     General: No focal deficit present.     Mental Status: She is alert. Mental status is at baseline.  Psychiatric:        Mood and Affect: Mood normal.        Behavior: Behavior normal.         Assessment & Plan:   Assessment & Plan Essential hypertension Blood pressure controlled with losartan  50 mg daily. Current reading 128/82 mmHg. Losartan  provides renal protection. - Continue losartan  50 mg daily.  Generalized anxiety disorder Anxiety managed with Buspar   and sertraline . Occasional anxiety-induced diarrhea. Probiotic beneficial for IBS symptoms. - Continue Buspar  15 mg twice daily. - Continue sertraline  50 mg daily. - Continue probiotic (Align) once daily. - Consider fiber supplements like Metamucil or Benefiber if needed, starting with a low dose and gradually increasing.  Osteoarthritis Managed with naproxen and Tylenol . Gabapentin  used for neuropathy related to degenerative disc disease. - Continue naproxen with food, reduce dose if possible to minimize kidney impact. - Use Tylenol  as needed for additional pain relief.  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 Gabapentin  effective. Under neurosurgeon care, considering future surgery. - Continue gabapentin  300 mg twice daily. - Plan for MRI with pre-medication for claustrophobia. - Follow up with neurosurgeon Dr. Mavis as needed.  Irritable bowel syndrome with diarrhea and fecal incontinence IBS symptoms managed with probiotics. Anxiety may exacerbate symptoms. - Continue probiotic (Align) once daily. - Consider fiber supplements like Metamucil or Benefiber if needed, starting with a low dose and gradually increasing.  Intertrigo of perineal region Intertrigo due to pad use and moisture. Previous steroid cream unsuitable. - Prescribe ketoconazole  cream, apply once daily for two weeks or until symptoms resolve.  History of basal cell and squamous cell skin cancer Regular dermatology monitoring. No melanoma history. - Continue regular dermatology follow-ups every 4-6 months.  Hyperlipidemia Cholesterol slightly elevated. Fish oil used. Red yeast rice discontinued due to diarrhea. - Consider reintroducing red yeast rice if diarrhea does not recur.  - losartan  (COZAAR ) 50 MG tablet; Take 1 tablet (50 mg total) by mouth daily.  Dispense: 90 tablet; Refill: 1 - fluorouracil (EFUDEX) 5 %  cream; Apply topically 2 (two) times daily. - sertraline  (ZOLOFT ) 50 MG tablet; Take 1 tablet (50 mg total) by mouth daily.  Dispense: 90 tablet; Refill: 1 - busPIRone  (BUSPAR ) 15 MG tablet; Take 1 tablet (15 mg total) by mouth 2 (two) times  daily.  Dispense: 180 tablet; Refill: 1 - gabapentin  (NEURONTIN ) 300 MG capsule; Take 1 capsule (300 mg total) by mouth 2 (two) times daily.  Dispense: 180 capsule; Refill: 1 - Pneumococcal conjugate vaccine 20-valent (Prevnar 20) - MM 3D SCREENING MAMMOGRAM BILATERAL BREAST; Future   Return in about 6 months (around 01/25/2025).   Sharyle Fischer, DO

## 2024-10-10 ENCOUNTER — Ambulatory Visit

## 2024-10-10 DIAGNOSIS — Z23 Encounter for immunization: Secondary | ICD-10-CM | POA: Diagnosis not present

## 2024-12-05 ENCOUNTER — Ambulatory Visit

## 2025-01-25 ENCOUNTER — Ambulatory Visit: Admitting: Internal Medicine
